# Patient Record
Sex: Female | Born: 1995 | Race: Black or African American | Hispanic: No | Marital: Married
Health system: Midwestern US, Academic
[De-identification: ages and names within clinical notes are randomized; demographics above are authoritative.]

## PROBLEM LIST (undated history)

## (undated) ENCOUNTER — Inpatient Hospital Stay (HOSPITAL_COMMUNITY): Payer: Self-pay

## (undated) DIAGNOSIS — Z789 Other specified health status: Secondary | ICD-10-CM

## (undated) HISTORY — PX: WISDOM TOOTH EXTRACTION: SHX21

---

## 2017-10-24 ENCOUNTER — Encounter: Payer: Self-pay | Admitting: *Deleted

## 2017-10-25 ENCOUNTER — Inpatient Hospital Stay (HOSPITAL_COMMUNITY)
Admission: AD | Admit: 2017-10-25 | Discharge: 2017-10-25 | Disposition: A | Discharge status: Home / Self Care | Payer: Medicaid Other | Source: Ambulatory Visit | Attending: Obstetrics & Gynecology | Admitting: Obstetrics & Gynecology

## 2017-10-25 ENCOUNTER — Other Ambulatory Visit: Payer: Self-pay

## 2017-10-25 ENCOUNTER — Encounter (HOSPITAL_COMMUNITY): Payer: Self-pay

## 2017-10-25 DIAGNOSIS — N760 Acute vaginitis: Secondary | ICD-10-CM | POA: Insufficient documentation

## 2017-10-25 DIAGNOSIS — B9689 Other specified bacterial agents as the cause of diseases classified elsewhere: Secondary | ICD-10-CM | POA: Diagnosis not present

## 2017-10-25 DIAGNOSIS — O23592 Infection of other part of genital tract in pregnancy, second trimester: Secondary | ICD-10-CM | POA: Diagnosis not present

## 2017-10-25 DIAGNOSIS — Z3A26 26 weeks gestation of pregnancy: Secondary | ICD-10-CM | POA: Insufficient documentation

## 2017-10-25 DIAGNOSIS — O0932 Supervision of pregnancy with insufficient antenatal care, second trimester: Secondary | ICD-10-CM | POA: Diagnosis not present

## 2017-10-25 DIAGNOSIS — O9989 Other specified diseases and conditions complicating pregnancy, childbirth and the puerperium: Secondary | ICD-10-CM | POA: Diagnosis not present

## 2017-10-25 DIAGNOSIS — M545 Low back pain: Secondary | ICD-10-CM | POA: Diagnosis present

## 2017-10-25 DIAGNOSIS — M549 Dorsalgia, unspecified: Secondary | ICD-10-CM | POA: Diagnosis not present

## 2017-10-25 HISTORY — DX: Other specified health status: Z78.9

## 2017-10-25 LAB — URINALYSIS, ROUTINE W REFLEX MICROSCOPIC
BILIRUBIN URINE: NEGATIVE
Glucose, UA: NEGATIVE mg/dL
Hgb urine dipstick: NEGATIVE
KETONES UR: NEGATIVE mg/dL
Nitrite: NEGATIVE
PH: 7 (ref 5.0–8.0)
Protein, ur: NEGATIVE mg/dL
SPECIFIC GRAVITY, URINE: 1.009 (ref 1.005–1.030)

## 2017-10-25 LAB — DIFFERENTIAL
Basophils Absolute: 0 10*3/uL (ref 0.0–0.1)
Basophils Relative: 0 %
EOS PCT: 1 %
Eosinophils Absolute: 0.1 10*3/uL (ref 0.0–0.7)
LYMPHS ABS: 2.3 10*3/uL (ref 0.7–4.0)
LYMPHS PCT: 24 %
MONO ABS: 0.4 10*3/uL (ref 0.1–1.0)
MONOS PCT: 4 %
NEUTROS ABS: 6.6 10*3/uL (ref 1.7–7.7)
Neutrophils Relative %: 71 %

## 2017-10-25 LAB — WET PREP, GENITAL
SPERM: NONE SEEN
Trich, Wet Prep: NONE SEEN
Yeast Wet Prep HPF POC: NONE SEEN

## 2017-10-25 LAB — CBC
HEMATOCRIT: 29.1 % — AB (ref 36.0–46.0)
HEMOGLOBIN: 9.2 g/dL — AB (ref 12.0–15.0)
MCH: 26 pg (ref 26.0–34.0)
MCHC: 31.6 g/dL (ref 30.0–36.0)
MCV: 82.2 fL (ref 78.0–100.0)
Platelets: 298 10*3/uL (ref 150–400)
RBC: 3.54 MIL/uL — ABNORMAL LOW (ref 3.87–5.11)
RDW: 18.1 % — ABNORMAL HIGH (ref 11.5–15.5)
WBC: 9.4 10*3/uL (ref 4.0–10.5)

## 2017-10-25 LAB — ABO/RH: ABO/RH(D): O POS

## 2017-10-25 MED ORDER — METRONIDAZOLE 500 MG PO TABS
500.0000 mg | ORAL_TABLET | Freq: Two times a day (BID) | ORAL | 0 refills | Status: DC
Start: 2017-10-25 — End: 2017-11-04

## 2017-10-25 MED ORDER — IBUPROFEN 600 MG PO TABS
600.0000 mg | ORAL_TABLET | Freq: Once | ORAL | Status: AC
  Administered 2017-10-25: 600 mg via ORAL
  Filled 2017-10-25: qty 1

## 2017-10-25 NOTE — MAU Note (Signed)
Relocated from [place], has first appt in clinic on Dec 17th.  ? Contractions since last night.  Feels like tightnings and back pain. Baby moving a lot. No bleeding. No leaking.

## 2017-10-25 NOTE — Discharge Instructions (Signed)
Back Injury Prevention Back injuries can be very painful. They can also be difficult to heal. After having one back injury, you are more likely to injure your back again. It is important to learn how to avoid injuring or re-injuring your back. The following tips can help you to prevent a back injury. What should I know about physical fitness?  Exercise for 30 minutes per day on most days of the week or as directed by your health care provider. Make sure to: ? Do aerobic exercises, such as walking, jogging, biking, or swimming. ? Do exercises that increase balance and strength, such as tai chi and yoga. These can decrease your risk of falling and injuring your back. ? Do stretching exercises to help with flexibility. ? Try to develop strong abdominal muscles. Your abdominal muscles provide a lot of the support that is needed by your back.  Maintain a healthy weight. This helps to decrease your risk of a back injury. What should I know about my diet?  Talk with your health care provider about your overall diet. Take supplements and vitamins only as directed by your health care provider.  Talk with your health care provider about how much calcium and vitamin D you need each day. These nutrients help to prevent weakening of the bones (osteoporosis). Osteoporosis can cause broken (fractured) bones, which lead to back pain.  Include good sources of calcium in your diet, such as dairy products, green leafy vegetables, and products that have had calcium added to them (fortified).  Include good sources of vitamin D in your diet, such as milk and foods that are fortified with vitamin D. What should I know about my posture?  Sit up straight and stand up straight. Avoid leaning forward when you sit or hunching over when you stand.  Choose chairs that have good low-back (lumbar) support.  If you work at a desk, sit close to it so you do not need to lean over. Keep your chin tucked in. Keep your neck  drawn back, and keep your elbows bent at a right angle. Your arms should look like the letter "L."  Sit high and close to the steering wheel when you drive. Add a lumbar support to your car seat, if needed.  Avoid sitting or standing in one position for very long. Take breaks to get up, stretch, and walk around at least one time every hour. Take breaks every hour if you are driving for long periods of time.  Sleep on your side with your knees slightly bent, or sleep on your back with a pillow under your knees. Do not lie on the front of your body to sleep. What should I know about lifting, twisting, and reaching? Lifting and Heavy Lifting   Avoid heavy lifting, especially repetitive heavy lifting. If you must do heavy lifting: ? Stretch before lifting. ? Work slowly. ? Rest between lifts. ? Use a tool such as a cart or a dolly to move objects if one is available. ? Make several small trips instead of carrying one heavy load. ? Ask for help when you need it, especially when moving big objects.  Follow these steps when lifting: ? Stand with your feet shoulder-width apart. ? Get as close to the object as you can. Do not try to pick up a heavy object that is far from your body. ? Use handles or lifting straps if they are available. ? Bend at your knees. Squat down, but keep your heels off the  floor. ? Keep your shoulders pulled back, your chin tucked in, and your back straight. ? Lift the object slowly while you tighten the muscles in your legs, abdomen, and buttocks. Keep the object as close to the center of your body as possible.  Follow these steps when putting down a heavy load: ? Stand with your feet shoulder-width apart. ? Lower the object slowly while you tighten the muscles in your legs, abdomen, and buttocks. Keep the object as close to the center of your body as possible. ? Keep your shoulders pulled back, your chin tucked in, and your back straight. ? Bend at your knees. Squat  down, but keep your heels off the floor. ? Use handles or lifting straps if they are available. Twisting and Reaching  Avoid lifting heavy objects above your waist.  Do not twist at your waist while you are lifting or carrying a load. If you need to turn, move your feet.  Do not bend over without bending at your knees.  Avoid reaching over your head, across a table, or for an object on a high surface. What are some other tips?  Avoid wet floors and icy ground. Keep sidewalks clear of ice to prevent falls.  Do not sleep on a mattress that is too soft or too hard.  Keep items that are used frequently within easy reach.  Put heavier objects on shelves at waist level, and put lighter objects on lower or higher shelves.  Find ways to decrease your stress, such as exercise, massage, or relaxation techniques. Stress can build up in your muscles. Tense muscles are more vulnerable to injury.  Talk with your health care provider if you feel anxious or depressed. These conditions can make back pain worse.  Wear flat heel shoes with cushioned soles.  Avoid sudden movements.  Use both shoulder straps when carrying a backpack.  Do not use any tobacco products, including cigarettes, chewing tobacco, or electronic cigarettes. If you need help quitting, ask your health care provider. This information is not intended to replace advice given to you by your health care provider. Make sure you discuss any questions you have with your health care provider. Document Released: 12/15/2004 Document Revised: 04/14/2016 Document Reviewed: 11/11/2014 Elsevier Interactive Patient Education  2017 [place] of Pregnancy The second trimester is from week 13 through week 28, month 4 through 6. This is often the time in pregnancy that you feel your best. Often times, morning sickness has lessened or quit. You may have more energy, and you may get hungry more often. Your unborn baby (fetus)  is growing rapidly. At the end of the sixth month, he or she is about 9 inches long and weighs about 1 pounds. You will likely feel the baby move (quickening) between 18 and 20 weeks of pregnancy. Follow these instructions at home:  Avoid all smoking, herbs, and alcohol. Avoid drugs not approved by your doctor.  Do not use any tobacco products, including cigarettes, chewing tobacco, and electronic cigarettes. If you need help quitting, ask your doctor. You may get counseling or other support to help you quit.  Only take medicine as told by your doctor. Some medicines are safe and some are not during pregnancy.  Exercise only as told by your doctor. Stop exercising if you start having cramps.  Eat regular, healthy meals.  Wear a good support bra if your breasts are tender.  Do not use hot tubs, steam rooms, or saunas.  Wear your  seat belt when driving.  Avoid raw meat, uncooked cheese, and liter boxes and soil used by cats.  Take your prenatal vitamins.  Take 1500-2000 milligrams of calcium daily starting at the 20th week of pregnancy until you deliver your baby.  Try taking medicine that helps you poop (stool softener) as needed, and if your doctor approves. Eat more fiber by eating fresh fruit, vegetables, and whole grains. Drink enough fluids to keep your pee (urine) clear or pale yellow.  Take warm water baths (sitz baths) to soothe pain or discomfort caused by hemorrhoids. Use hemorrhoid cream if your doctor approves.  If you have puffy, bulging veins (varicose veins), wear support hose. Raise (elevate) your feet for 15 minutes, 3-4 times a day. Limit salt in your diet.  Avoid heavy lifting, wear low heals, and sit up straight.  Rest with your legs raised if you have leg cramps or low back pain.  Visit your dentist if you have not gone during your pregnancy. Use a soft toothbrush to brush your teeth. Be gentle when you floss.  You can have sex (intercourse) unless your  doctor tells you not to.  Go to your doctor visits. Get help if:  You feel dizzy.  You have mild cramps or pressure in your lower belly (abdomen).  You have a nagging pain in your belly area.  You continue to feel sick to your stomach (nauseous), throw up (vomit), or have watery poop (diarrhea).  You have bad smelling fluid coming from your vagina.  You have pain with peeing (urination). Get help right away if:  You have a fever.  You are leaking fluid from your vagina.  You have spotting or bleeding from your vagina.  You have severe belly cramping or pain.  You lose or gain weight rapidly.  You have trouble catching your breath and have chest pain.  You notice sudden or extreme puffiness (swelling) of your face, hands, ankles, feet, or legs.  You have not felt the baby move in over an hour.  You have severe headaches that do not go away with medicine.  You have vision changes. This information is not intended to replace advice given to you by your health care provider. Make sure you discuss any questions you have with your health care provider. Document Released: 02/01/2010 Document Revised: 04/14/2016 Document Reviewed: 01/08/2013 Elsevier Interactive Patient Education  2017 [employer].

## 2017-10-25 NOTE — MAU Provider Note (Signed)
History     CSN: [id number]  Arrival date and time: 10/25/17 1859   None     Chief Complaint  Patient presents with  . Contractions   HPI   Ms.Nancy Hess is a 21 y.o. female G2P1001 @ 2987w0d here in MAU with contraction like pain. States she feels her abdomen tightening off and on. She feels pain in her lower back that comes and goes. The tightening in her abdomen is not painful. The pain is in her lower back and that started last night. She has not had this pain before in this pregancy. No history of preterm delivery. [facility] an appointment in [facility] on [facility]; no prenatal care as of date. No recent intercourse.  Has not tried any medication for the pain.   OB History    Gravida Para Term Preterm AB Living   2 1 1     1    SAB TAB Ectopic Multiple Live Births           1      Past Medical History:  Diagnosis Date  . Medical history non-contributory     Past Surgical History:  Procedure Laterality Date  . WISDOM TOOTH EXTRACTION      Family History  Family history unknown: Yes    Social History   Tobacco Use  . Smoking status: Never Smoker  . Smokeless tobacco: Never Used  Substance Use Topics  . Alcohol use: No    Frequency: Never  . Drug use: No    Allergies: No Known Allergies  No medications prior to admission.   Results for orders placed or performed during the hospital encounter of 10/25/17 (from the past 48 hour(s))  Urinalysis, Routine w reflex microscopic     Status: Abnormal   Collection Time: 10/25/17  5:05 PM  Result Value Ref Range   Color, Urine YELLOW YELLOW   APPearance HAZY (A) CLEAR   Specific Gravity, Urine 1.009 1.005 - 1.030   pH 7.0 5.0 - 8.0   Glucose, UA NEGATIVE NEGATIVE mg/dL   Hgb urine dipstick NEGATIVE NEGATIVE   Bilirubin Urine NEGATIVE NEGATIVE   Ketones, ur NEGATIVE NEGATIVE mg/dL   Protein, ur NEGATIVE NEGATIVE mg/dL   Nitrite NEGATIVE NEGATIVE   Leukocytes, UA LARGE (A) NEGATIVE   RBC / HPF 0-5 0 - 5 RBC/hpf   WBC,  UA 0-5 0 - 5 WBC/hpf   Bacteria, UA FEW (A) NONE SEEN   Squamous Epithelial / LPF 6-30 (A) NONE SEEN   Mucus PRESENT   Wet prep, genital     Status: Abnormal   Collection Time: 10/25/17  8:20 PM  Result Value Ref Range   Yeast Wet Prep HPF POC NONE SEEN NONE SEEN   Trich, Wet Prep NONE SEEN NONE SEEN   Clue Cells Wet Prep HPF POC PRESENT (A) NONE SEEN   WBC, Wet Prep HPF POC MANY (A) NONE SEEN    Comment: MANY BACTERIA SEEN   Sperm NONE SEEN    Review of Systems  Gastrointestinal: Negative for abdominal pain, constipation, nausea and rectal pain.  Genitourinary: Negative for dysuria, flank pain, hematuria, vaginal bleeding and vaginal discharge.  Musculoskeletal: Positive for back pain.   Physical Exam   Blood pressure (!) 111/59, pulse (!) 116, temperature 98 F (36.7 C), temperature source Oral, resp. rate 18, height 5\' 3"  (1.6 m), weight 150 lb (68 kg), last menstrual period 05/10/2017, SpO2 100 %.  Physical Exam  Constitutional: She is oriented to person, place, and  time. She appears well-developed and well-nourished. No distress.  HENT:  Head: Normocephalic.  Eyes: Pupils are equal, round, and reactive to light.  GI: Soft. Normal appearance. There is no CVA tenderness.  Genitourinary:  Genitourinary Comments: Vagina - Small- moderate amount of white vaginal discharge, + odor  Cervix - No contact bleeding, no active bleeding  Bimanual exam: Cervix closed GC/Chlam, wet prep done Chaperone present for exam.   Musculoskeletal: Normal range of motion.  Neurological: She is alert and oriented to person, place, and time.  Skin: Skin is warm. She is not diaphoretic.  Psychiatric: Her behavior is normal.   Fetal Tracing: Baseline: 145 bpm Variability: Moderate  Accelerations: 10x10 Decelerations: None Toco: None  MAU Course  Procedures  None  MDM  Prenatal panel collected Urine culture pending Wet prep and GC  Ibuprofen 600 mg PO given in MAU; waiting to see if  pain decreases. Report given to M. Mayford KnifeWilliams CNM who resumes care of the patient.   Assessment and Plan   A:  1. BV (bacterial vaginosis)   2. Back pain affecting pregnancy in second trimester   3. No prenatal care in current pregnancy in second trimester     P:  Discharge home in stable condition, with strict preterm labor precautions Follow up in the [facility] as scheduled US ordered; they will call to schedule No ibuprofen after 29 weeks Return to MAU if symptoms worsen Rx: Flagyl   Nancy CarbonJennifer Rasch 10/25/2017, 8:13 PM   Pain is greatly improved after ibuprofen Will discharge home Has appt for clinic followup Aviva SignsWilliams, Nancy Hess, CNM

## 2017-10-26 LAB — RPR: RPR: NONREACTIVE

## 2017-10-26 LAB — HIV ANTIBODY (ROUTINE TESTING W REFLEX): HIV Screen 4th Generation wRfx: NONREACTIVE

## 2017-10-26 LAB — HEPATITIS B SURFACE ANTIGEN: Hepatitis B Surface Ag: NEGATIVE

## 2017-10-26 LAB — GC/CHLAMYDIA PROBE AMP (~~LOC~~) NOT AT ARMC
Chlamydia: NEGATIVE
NEISSERIA GONORRHEA: NEGATIVE

## 2017-10-27 LAB — CULTURE, OB URINE
Culture: 30000 — AB
Special Requests: NORMAL

## 2017-10-27 LAB — RUBELLA SCREEN: Rubella: 1.62 {index}

## 2017-11-04 ENCOUNTER — Encounter (HOSPITAL_COMMUNITY): Payer: Self-pay | Admitting: *Deleted

## 2017-11-04 ENCOUNTER — Inpatient Hospital Stay (HOSPITAL_COMMUNITY)
Admission: AD | Admit: 2017-11-04 | Discharge: 2017-11-04 | Disposition: A | Discharge status: Home / Self Care | Payer: Medicaid Other | Source: Ambulatory Visit | Attending: Obstetrics and Gynecology | Admitting: Obstetrics and Gynecology

## 2017-11-04 DIAGNOSIS — R1030 Lower abdominal pain, unspecified: Secondary | ICD-10-CM | POA: Diagnosis present

## 2017-11-04 DIAGNOSIS — O4702 False labor before 37 completed weeks of gestation, second trimester: Secondary | ICD-10-CM

## 2017-11-04 DIAGNOSIS — N760 Acute vaginitis: Secondary | ICD-10-CM

## 2017-11-04 DIAGNOSIS — B9689 Other specified bacterial agents as the cause of diseases classified elsewhere: Secondary | ICD-10-CM | POA: Insufficient documentation

## 2017-11-04 DIAGNOSIS — O23592 Infection of other part of genital tract in pregnancy, second trimester: Secondary | ICD-10-CM | POA: Insufficient documentation

## 2017-11-04 DIAGNOSIS — Z3A27 27 weeks gestation of pregnancy: Secondary | ICD-10-CM | POA: Diagnosis not present

## 2017-11-04 DIAGNOSIS — O0932 Supervision of pregnancy with insufficient antenatal care, second trimester: Secondary | ICD-10-CM | POA: Insufficient documentation

## 2017-11-04 LAB — URINALYSIS, ROUTINE W REFLEX MICROSCOPIC
Bilirubin Urine: NEGATIVE
GLUCOSE, UA: NEGATIVE mg/dL
HGB URINE DIPSTICK: NEGATIVE
Ketones, ur: NEGATIVE mg/dL
NITRITE: NEGATIVE
PH: 7 (ref 5.0–8.0)
Protein, ur: NEGATIVE mg/dL
Specific Gravity, Urine: 1.01 (ref 1.005–1.030)

## 2017-11-04 MED ORDER — CLINDAMYCIN HCL 300 MG PO CAPS: 300.0000 mg | ORAL_CAPSULE | Freq: Two times a day (BID) | ORAL | 0 refills | Status: AC

## 2017-11-04 NOTE — MAU Note (Signed)
Contractions for a week. Stronger tonight. Denies LOF or bleeding. Cervix closed last wk

## 2017-11-04 NOTE — MAU Note (Signed)
PT SAYS  SOMETIME  LAST WEEK SHE WAS HERE  FOR  [facility].       ALL WEEK SHE  HAS  CONTINUED  TO CONTRACT.    LAST SEX-   SEPT.    SHE HAD 1  PNV    IN [place],   SHE HAS AN APPOINTMENT ON 11-06-17 IN CLINIC

## 2017-11-04 NOTE — MAU Provider Note (Cosign Needed)
  History     CSN: [id number]  Arrival date and time: 11/04/17 0120   None     Chief Complaint  Patient presents with  . Contractions   21 yo G2P1001 at 1547w3d presents with uncomfortable tightening in her lower abdomen that occurs every 10 minutes for the past 10 days. Was seen in MAU for same 10 days ago and diagnosed with BV- prescribed flagyl and patient has been taking as prescribed. Nothing in vagina in last 2 days. Denies vaginal bleeding or loss of fluid. Admits to vaginal discharge and does not think medicine has helped BV. She has had no prenatal care this pregnancy.     Past Medical History:  Diagnosis Date  . Medical history non-contributory     Past Surgical History:  Procedure Laterality Date  . WISDOM TOOTH EXTRACTION      Family History  Family history unknown: Yes    Social History   Tobacco Use  . Smoking status: Never Smoker  . Smokeless tobacco: Never Used  Substance Use Topics  . Alcohol use: No    Frequency: Never  . Drug use: No    Allergies: No Known Allergies  Medications Prior to Admission  Medication Sig Dispense Refill Last Dose  . metroNIDAZOLE (FLAGYL) 500 MG tablet Take 1 tablet (500 mg total) by mouth 2 (two) times daily. 14 tablet 0     Review of Systems  Constitutional: Negative for activity change and appetite change.  HENT: Negative for congestion and dental problem.   Eyes: Negative for discharge and itching.  Respiratory: Negative for apnea and chest tightness.   Cardiovascular: Negative for chest pain and leg swelling.  Gastrointestinal: Positive for abdominal pain. Negative for vomiting.  Endocrine: Negative for cold intolerance and heat intolerance.  Genitourinary: Positive for vaginal discharge. Negative for vaginal bleeding.  Musculoskeletal: Negative for arthralgias and back pain.  Neurological: Negative for dizziness and light-headedness.  Hematological: Negative for adenopathy. Does not bruise/bleed easily.    Physical Exam   Blood pressure 103/61, pulse 99, temperature 98.1 F (36.7 C), resp. rate 18, height 5\' 3"  (1.6 m), weight 152 lb (68.9 kg), last menstrual period 05/10/2017.  Physical Exam  Constitutional: She is oriented to person, place, and time. She appears well-developed and well-nourished.  HENT:  Head: Normocephalic and atraumatic.  Eyes: Conjunctivae are normal. Pupils are equal, round, and reactive to light.  Neck: Normal range of motion. Neck supple.  Cardiovascular: Normal rate and intact distal pulses.  Respiratory: Effort normal. No respiratory distress.  GI: Soft. There is no tenderness. There is no rebound and no guarding.  Genitourinary: Vaginal discharge found.  Genitourinary Comments: Cervical exam: closed/thick/high  Musculoskeletal: Normal range of motion. She exhibits no edema.  Neurological: She is alert and oriented to person, place, and time.  Skin: Skin is warm and dry.  Psychiatric: She has a normal mood and affect. Her behavior is normal.    MAU Course  Procedures  EFM: 140 bpm/ mod var/no decels Toco: no contractions  MDM Cervix is closed and no contractions on toco, so low suspicion for preterm labor. Evaluated vaginal discharge under microscope and saw many clue cells.  Assessment and Plan  1. Pregnancy at 27 weeks completed gestation- follow up in 2 days for initial prenatal appointment 2. Bacterial vaginosis- treat with clindamycin since symptoms have not improved with metronidizole  Chubb Corporationmber Rogene Meth 11/04/2017, 1:52 AM

## 2017-11-04 NOTE — Discharge Instructions (Signed)
Bacterial Vaginosis Bacterial vaginosis is a vaginal infection that occurs when the normal balance of bacteria in the vagina is disrupted. It results from an overgrowth of certain bacteria. This is the most common vaginal infection among women ages 15-44. Because bacterial vaginosis increases your risk for STIs (sexually transmitted infections), getting treated can help reduce your risk for chlamydia, gonorrhea, herpes, and HIV (human immunodeficiency virus). Treatment is also important for preventing complications in pregnant women, because this condition can cause an early (premature) delivery. What are the causes? This condition is caused by an increase in harmful bacteria that are normally present in small amounts in the vagina. However, the reason that the condition develops is not fully understood. What increases the risk? The following factors may make you more likely to develop this condition:  Having a new sexual partner or multiple sexual partners.  Having unprotected sex.  Douching.  Having an intrauterine device (IUD).  Smoking.  Drug and alcohol abuse.  Taking certain antibiotic medicines.  Being pregnant.  You cannot get bacterial vaginosis from toilet seats, bedding, swimming pools, or contact with objects around you. What are the signs or symptoms? Symptoms of this condition include:  Grey or white vaginal discharge. The discharge can also be watery or foamy.  A fish-like odor with discharge, especially after sexual intercourse or during menstruation.  Itching in and around the vagina.  Burning or pain with urination.  Some women with bacterial vaginosis have no signs or symptoms. How is this diagnosed? This condition is diagnosed based on:  Your medical history.  A physical exam of the vagina.  Testing a sample of vaginal fluid under a microscope to look for a large amount of bad bacteria or abnormal cells. Your health care provider may use a cotton swab  or a small wooden spatula to collect the sample.  How is this treated? This condition is treated with antibiotics. These may be given as a pill, a vaginal cream, or a medicine that is put into the vagina (suppository). If the condition comes back after treatment, a second round of antibiotics may be needed. Follow these instructions at home: Medicines  Take over-the-counter and prescription medicines only as told by your health care provider.  Take or use your antibiotic as told by your health care provider. Do not stop taking or using the antibiotic even if you start to feel better. General instructions  If you have a female sexual partner, tell her that you have a vaginal infection. She should see her health care provider and be treated if she has symptoms. If you have a female sexual partner, he does not need treatment.  During treatment: ? Avoid sexual activity until you finish treatment. ? Do not douche. ? Avoid alcohol as directed by your health care provider. ? Avoid breastfeeding as directed by your health care provider.  Drink enough water and fluids to keep your urine clear or pale yellow.  Keep the area around your vagina and rectum clean. ? Wash the area daily with warm water. ? Wipe yourself from front to back after using the toilet.  Keep all follow-up visits as told by your health care provider. This is important. How is this prevented?  Do not douche.  Wash the outside of your vagina with warm water only.  Use protection when having sex. This includes latex condoms and dental dams.  Limit how many sexual partners you have. To help prevent bacterial vaginosis, it is best to have sex with just   one partner (monogamous).  Make sure you and your sexual partner are tested for STIs.  Wear cotton or cotton-lined underwear.  Avoid wearing tight pants and pantyhose, especially during summer.  Limit the amount of alcohol that you drink.  Do not use any products that  contain nicotine or tobacco, such as cigarettes and e-cigarettes. If you need help quitting, ask your health care provider.  Do not use illegal drugs. Where to find more information:  Centers for Disease Control and Prevention: www.cdc.gov/std  American Sexual Health Association (ASHA): www.ashastd.org  [place].S. Department of Health and Human Services, Office on Women's Health: www.womenshealth.gov/ or https://www.womenshealth.gov/a-z-topics/bacterial-vaginosis Contact a health care provider if:  Your symptoms do not improve, even after treatment.  You have more discharge or pain when urinating.  You have a fever.  You have pain in your abdomen.  You have pain during sex.  You have vaginal bleeding between periods. Summary  Bacterial vaginosis is a vaginal infection that occurs when the normal balance of bacteria in the vagina is disrupted.  Because bacterial vaginosis increases your risk for STIs (sexually transmitted infections), getting treated can help reduce your risk for chlamydia, gonorrhea, herpes, and HIV (human immunodeficiency virus). Treatment is also important for preventing complications in pregnant women, because the condition can cause an early (premature) delivery.  This condition is treated with antibiotic medicines. These may be given as a pill, a vaginal cream, or a medicine that is put into the vagina (suppository). This information is not intended to replace advice given to you by your health care provider. Make sure you discuss any questions you have with your health care provider. Document Released: 11/07/2005 Document Revised: 07/23/2016 Document Reviewed: 07/23/2016 Elsevier Interactive Patient Education  2017 [employer]  

## 2017-11-06 ENCOUNTER — Encounter: Payer: Self-pay | Admitting: Advanced Practice Midwife

## 2017-11-06 ENCOUNTER — Ambulatory Visit (INDEPENDENT_AMBULATORY_CARE_PROVIDER_SITE_OTHER): Payer: Medicaid Other | Admitting: Medical

## 2017-11-06 VITALS — BP 108/70 | HR 111 | Wt 155.4 lb

## 2017-11-06 DIAGNOSIS — O0933 Supervision of pregnancy with insufficient antenatal care, third trimester: Secondary | ICD-10-CM

## 2017-11-06 DIAGNOSIS — Z3482 Encounter for supervision of other normal pregnancy, second trimester: Secondary | ICD-10-CM

## 2017-11-06 DIAGNOSIS — Z3480 Encounter for supervision of other normal pregnancy, unspecified trimester: Secondary | ICD-10-CM

## 2017-11-06 DIAGNOSIS — O0932 Supervision of pregnancy with insufficient antenatal care, second trimester: Secondary | ICD-10-CM

## 2017-11-06 NOTE — Progress Notes (Signed)
   PRENATAL VISIT NOTE  Subjective:  Nancy Hess is a 21 y.o. G2P1001 at 6469w5d being seen today for her first prenatal care visit.  She is currently monitored for the following issues for this low-risk pregnancy and has Supervision of other normal pregnancy, antepartum on their problem list.  Patient reports occasional contractions. q 14 minutes today which is the same as she has been experiencing. She was seen in [facility] twice in the past 10 days with the same complaint. She was closed/thick at both visits, most recently 11/04/17. She is being treated with vaginal clindamycin for BV that was unresponsive to Flagyl. Contractions: Irritability. Vag. Bleeding: None.  Movement: Present. Denies leaking of fluid.   The following portions of the patient's history were reviewed and updated as appropriate: allergies, current medications, past family history, past medical history, past social history, past surgical history and problem list. Problem list updated.  Objective:   Vitals:   11/06/17 0813  BP: 108/70  Pulse: (!) 111  Weight: 155 lb 6.4 oz (70.5 kg)    Fetal Status: Fetal Heart Rate (bpm): 151 Fundal Height: 26 cm Movement: Present     General:  Alert, oriented and cooperative. Patient is in no acute distress.  Skin: Skin is warm and dry. No rash noted.   Cardiovascular: Normal heart rate noted  Respiratory: Normal respiratory effort, no problems with respiration noted  Abdomen: Soft, gravid, appropriate for gestational age.  Pain/Pressure: Present     Pelvic: Cervical exam deferred        Extremities: Normal range of motion.  Edema: None  Mental Status:  Normal mood and affect. Normal behavior. Normal judgment and thought content.   Assessment and Plan:  Pregnancy: G2P1001 at 8169w5d  1. Supervision of other normal pregnancy, antepartum - Antibody screen - [employer] Schedule Optimization  2. Late prenatal care - [place] but [place] care prior to [place]    Preterm labor symptoms and general obstetric precautions including but not limited to vaginal bleeding, contractions, leaking of fluid and fetal movement were reviewed in detail with the patient. Please refer to After Visit Summary for other counseling recommendations.  Return in about 2 weeks (around 11/20/2017) for LOB, 28 week labs (fasting).   Vonzella NippleJulie Wenzel, PA-C

## 2017-11-06 NOTE — Patient Instructions (Addendum)
Fetal Movement Counts Patient Name: ________________________________________________ Patient Due Date: ____________________ What is a fetal movement count? A fetal movement count is the number of times that you feel your baby move during a certain amount of time. This may also be called a fetal kick count. A fetal movement count is recommended for every pregnant woman. You may be asked to start counting fetal movements as early as week 28 of your pregnancy. Pay attention to when your baby is most active. You may notice your baby's sleep and wake cycles. You may also notice things that make your baby move more. You should do a fetal movement count:  When your baby is normally most active.  At the same time each day.  A good time to count movements is while you are resting, after having something to eat and drink. How do I count fetal movements? 1. Find a quiet, comfortable area. Sit, or lie down on your side. 2. Write down the date, the start time and stop time, and the number of movements that you felt between those two times. Take this information with you to your health care visits. 3. For 2 hours, count kicks, flutters, swishes, rolls, and jabs. You should feel at least 10 movements during 2 hours. 4. You may stop counting after you have felt 10 movements. 5. If you do not feel 10 movements in 2 hours, have something to eat and drink. Then, keep resting and counting for 1 hour. If you feel at least 4 movements during that hour, you may stop counting. Contact a health care provider if:  You feel fewer than 4 movements in 2 hours.  Your baby is not moving like he or she usually does. Date: ____________ Start time: ____________ Stop time: ____________ Movements: ____________ Date: ____________ Start time: ____________ Stop time: ____________ Movements: ____________ Date: ____________ Start time: ____________ Stop time: ____________ Movements: ____________ Date: ____________ Start time:  ____________ Stop time: ____________ Movements: ____________ Date: ____________ Start time: ____________ Stop time: ____________ Movements: ____________ Date: ____________ Start time: ____________ Stop time: ____________ Movements: ____________ Date: ____________ Start time: ____________ Stop time: ____________ Movements: ____________ Date: ____________ Start time: ____________ Stop time: ____________ Movements: ____________ Date: ____________ Start time: ____________ Stop time: ____________ Movements: ____________ This information is not intended to replace advice given to you by your health care provider. Make sure you discuss any questions you have with your health care provider. Document Released: 12/07/2006 Document Revised: 07/06/2016 Document Reviewed: 12/17/2015 Elsevier Interactive Patient Education  2018 [employer]. [employer] of the uterus can occur throughout pregnancy, but they are not always a sign that you are in labor. You may have practice contractions called Braxton Hicks contractions. These false labor contractions are sometimes confused with true labor. What are [place] contractions? Braxton Hicks contractions are tightening movements that occur in the muscles of the uterus before labor. Unlike true labor contractions, these contractions do not result in opening (dilation) and thinning of the cervix. Toward the end of pregnancy (32-34 weeks), Braxton Hicks contractions can happen more often and may become stronger. These contractions are sometimes difficult to tell apart from true labor because they can be very uncomfortable. You should not feel embarrassed if you go to the hospital with false labor. Sometimes, the only way to tell if you are in true labor is for your health care provider to look for changes in the cervix. The health care provider will do a physical exam and may monitor your contractions.  If you are not in true labor, the exam  should show that your cervix is not dilating and your water has not broken. If there are no prenatal problems or other health problems associated with your pregnancy, it is completely safe for you to be sent home with false labor. You may continue to have Braxton Hicks contractions until you go into true labor. How can I tell the difference between true labor and false labor?  Differences ? False labor ? Contractions last 30-70 seconds.: Contractions are usually shorter and not as strong as true labor contractions. ? Contractions become very regular.: Contractions are usually irregular. ? Discomfort is usually felt in the top of the uterus, and it spreads to the lower abdomen and low back.: Contractions are often felt in the front of the lower abdomen and in the groin. ? Contractions do not go away with walking.: Contractions may go away when you walk around or change positions while lying down. ? Contractions usually become more intense and increase in frequency.: Contractions get weaker and are shorter-lasting as time goes on. ? The cervix dilates and gets thinner.: The cervix usually does not dilate or become thin. Follow these instructions at home:  Take over-the-counter and prescription medicines only as told by your health care provider.  Keep up with your usual exercises and follow other instructions from your health care provider.  Eat and drink lightly if you think you are going into labor.  If Braxton Hicks contractions are making you uncomfortable: ? Change your position from lying down or resting to walking, or change from walking to resting. ? Sit and rest in a tub of warm water. ? Drink enough fluid to keep your urine clear or pale yellow. Dehydration may cause these contractions. ? Do slow and deep breathing several times an hour.  Keep all follow-up prenatal visits as told by your health care provider. This is important. Contact a health care provider if:  You have a  fever.  You have continuous pain in your abdomen. Get help right away if:  Your contractions become stronger, more regular, and closer together.  You have fluid leaking or gushing from your vagina.  You pass blood-tinged mucus (bloody show).  You have bleeding from your vagina.  You have low back pain that you never had before.  You feel your baby's head pushing down and causing pelvic pressure.  Your baby is not moving inside you as much as it used to. Summary  Contractions that occur before labor are called Braxton Hicks contractions, false labor, or practice contractions.  Braxton Hicks contractions are usually shorter, weaker, farther apart, and less regular than true labor contractions. True labor contractions usually become progressively stronger and regular and they become more frequent.  Manage discomfort from [facility] contractions by changing position, resting in a warm bath, drinking plenty of water, or practicing deep breathing. This information is not intended to replace advice given to you by your health care provider. Make sure you discuss any questions you have with your health care provider. Document Released: 11/07/2005 Document Revised: 09/26/2016 Document Reviewed: 09/26/2016 Elsevier Interactive Patient Education  2017 [place] [address] [place], [place]  [address] Phone - [phone]   Fax - [phone]  [facility] [address] [place], [place] [address] Phone - [phone]   Fax - [place] [address]  [place], [place]  [address] Phone - [phone]   Fax - [phone]  [place]. [address], [place] [address] [place], [place]  [address] Phone - [phone]   Fax - [phone]  [place] [address] [place], [place]  [address] Phone - [phone]   Fax -  [phone]  [facility] [address] [place], [place]  [address] Phone - [phone]   Fax - [place] [address] [place], [place]  [address] Phone - [phone]   Fax - [phone]  [place] [address] [place], [place] [address] [place], [place]  [address] Phone - [phone]   Fax - [place] [address] [place], [place]  [address] Phone - [phone]   Fax - [phone] [facility] [place]. [address] [place], [place]  [address] Phone - [phone]   Fax - [phone]  [place] [address] [place], [place]  [address] Phone - [phone]   Fax - [phone]  [facility] [place], [place], [place]  [address] Phone - [phone]   Fax - [place] [address] [place], [place]  [address] Phone - [phone]   Fax - [phone]  [facility] [address] [place], [place]  [address] Phone - [place], [place]  [address] Phone - [phone]   Fax - [place] [address], [place], [place]  [address] Phone - [phone]   Fax - [phone]  [place] [address] [place], [place]  [address] Phone - [phone]   Fax - [place]. [place], [place]  [address] Phone - [phone]   Fax - [place], [place], [place]  [address] Phone - [phone]   Fax - [place] [address], [place], [place]  [address] Phone - [phone]   Fax - [phone]  [place] [address], [place], [place]  [address] Phone - [phone]   Fax - [place] [place], [place]  [address] Phone - [phone]   Fax - [phone]  [place] [address] [place], [place]  [address] Phone - [phone]   Fax - [phone] [place] [address] [place],   [address] Phone - [phone]   Fax - [place] [address] [place],   [address] Phone - [phone]   Fax - [phone]  [place] [address], [place],   [address] Phone - [phone]   Fax - [phone]  [place] MD [address] [place] [address] Phone [phone]  Fax [phone]   Childbirth Education Options: [facility] Department Classes:  Childbirth education classes can help you get ready for a positive parenting experience. You can also meet other expectant parents and get free stuff for your baby. Each class runs for five weeks on the same night and costs $45 for the mother-to-be and her support person. Medicaid covers the cost if you are eligible. Call [phone] to register. [facility]  Childbirth Education:  [phone] or [phone] or [email or web address]  Baby & Me Class: Discuss newborn & infant parenting and family adjustment issues with other new mothers in a relaxed environment. Each week brings a new speaker or baby-centered activity. We encourage new mothers to join [place] every Thursday at 11:00am. Babies birth until crawling. No registration or fee. Daddy [employer]: This course offers Dads-to-be the tools and knowledge needed to feel confident on their journey to becoming new fathers. Experienced dads, who have been trained as coaches, teach dads-to-be how to hold, comfort, diaper, swaddle and play with their infant while  being able to support the new mom as well. A class for men taught by men. $25/dad Big Brother/Big Sister: Let your children share in the joy of a new brother or sister in this special class designed just for them. Class includes discussion about how families care for babies: swaddling, holding, diapering, safety as well as how they can be helpful in their new role. This class is designed for children ages 49 to 18, but any age is welcome. Please register each child individually. $5/child  Mom Talk: This mom-led group offers support and connection to mothers as they journey through the adjustments and struggles of that sometimes overwhelming first year after the birth of a child. Tuesdays at 10:00am and Thursdays at 6:00pm. Babies welcome. No registration or fee. Breastfeeding Support Group: This group is a mother-to-mother support circle where moms have the opportunity to share their breastfeeding experiences. A Lactation Consultant is present for questions and concerns. Meets each Tuesday at 11:00am. No fee or registration. Breastfeeding Your Baby: Learn what to expect in the first days of breastfeeding your newborn.  This class will help you feel more confident with the skills needed to begin your breastfeeding experience. Many new mothers are concerned about breastfeeding after leaving the hospital. This class will also address the most common fears and challenges about breastfeeding during the first few weeks, months and beyond. (call for fee) Comfort Techniques and Tour: This 2 hour interactive class will provide you the opportunity to learn & practice hands-on techniques that can help relieve some of the discomfort of labor and encourage your baby to rotate toward the best position for birth. You and your partner will be able to try a variety of labor positions with birth balls and rebozos as well as practice breathing, relaxation, and visualization techniques. A tour of the [facility] is included with this class. $20 per registrant and support person Childbirth Class- Weekend Option: This class is a Weekend version of our Birth & Baby series. It is designed for parents who have a difficult time fitting several weeks of classes into their schedule. It covers the care of your newborn and the basics of labor and childbirth. It also includes a [place] of [facility] and lunch. The class is held two consecutive days: beginning on Friday evening from 6:30 - 8:30 p.m. and the next day, Saturday from 9 a.m. - 4 p.m. (call for fee) Doren Custard Class: Interested in a waterbirth?  This informational class will help you discover whether waterbirth is the right fit for you. Education about waterbirth itself, supplies you would need and how to assemble your support team is what you can expect from this class. Some obstetrical practices require this class in order to pursue a waterbirth. (Not all obstetrical practices offer waterbirth-check with your healthcare provider.) Register only the expectant mom, but you are encouraged to  bring your partner to class! Required if planning waterbirth, no fee. Infant/Child CPR: Parents, grandparents, babysitters, and friends learn Cardio-Pulmonary Resuscitation skills for infants and children. You will also learn how to treat both conscious and unconscious choking in infants and children. This Family & Friends program does not offer certification. Register each participant individually to ensure that enough mannequins are available. (Call for fee) Grandparent Love: Expecting a grandbaby? This class is for you! Learn about the latest infant care and safety recommendations and ways to support your own child as he or she transitions into the parenting role. Taught by Registered Nurses who are childbirth instructors, but most importantly...they are grandmothers too! $10/person. Childbirth Class- Natural Childbirth: This series of 5 weekly  classes is for expectant parents who want to learn and practice natural methods of coping with the process of labor and childbirth. Relaxation, breathing, massage, visualization, role of the partner, and helpful positioning are highlighted. Participants learn how to be confident in their body's ability to give birth. This class will empower and help parents make informed decisions about their own care. Includes discussion that will help new parents transition into the immediate postpartum period. [facility] is included. We suggest taking this class between 25-32 weeks, but it's only a recommendation. $75 per registrant and one support person or $30 Medicaid. Childbirth Class- 3 week Series: This option of 3 weekly classes helps you and your labor partner prepare for childbirth. Newborn care, labor & birth, cesarean birth, pain management, and comfort techniques are discussed and a [place] of [facility] is included. The class meets at the same time, on the same day of the week for 3 consecutive weeks beginning with the starting date you choose. $60 for registrant and one support person.  Marvelous Multiples: Expecting twins, triplets, or more? This class covers the differences in labor, birth, parenting, and breastfeeding issues that face multiples' parents. NICU tour is included. Led by a Certified Childbirth Educator who is the mother of twins. No fee. Caring for Baby: This class is for expectant and adoptive parents who want to learn and practice the most up-to-date newborn care for their babies. Focus is on birth through the first six weeks of life. Topics include feeding, bathing, diapering, crying, umbilical cord care, circumcision care and safe sleep. Parents learn to recognize symptoms of illness and when to call the pediatrician. Register only the mom-to-be and your partner or support person can plan to come with you! $10 per registrant and  support person Childbirth Class- online option: This online class offers you the freedom to complete a Birth and Baby series in the comfort of your own home. The flexibility of this option allows you to review sections at your own pace, at times convenient to you and your support people. It includes additional video information, animations, quizzes, and extended activities. Get organized with helpful eClass tools, checklists, and trackers. Once you register online for the class, you will receive an email within a few days to accept the invitation and begin the class when the time is right for you. The content will be available to you for 60 days. $60 for 60 days of online access for you and your support people.  Local Doulas: Natural Baby Doulas [email or web address] Tel: [phone] [email or web address] [phone] [email or web address] The Labor Hassell Halim  (also do waterbirth tub rental) [phone] [email or web address] Birth Doula [phone] [email or web address] [facility] Rhythms  [phone] [email or web address] [place] (  PADA) [email or web address] [place] Birth and Baby  [email or web address] Considering Waterbirth? Guide for patients at [facility] for [employer]  Why consider waterbirth?  . Gentle birth for babies . Less pain medicine used in labor . May allow for passive descent/less pushing . May reduce perineal tears  . More mobility and instinctive maternal position changes . Increased maternal relaxation . Reduced blood pressure in labor  Is waterbirth safe? What are the risks of infection, drowning or other complications?  . Infection: o Very low risk (3.7 % for tub vs 4.8% for bed) o 7 in 8000 waterbirths with documented  infection o Poorly cleaned equipment most common cause o Slightly lower group B strep transmission rate  . Drowning o Maternal:  - Very low risk   - Related to seizures or fainting o Newborn:  - Very low risk. No evidence of increased risk of respiratory problems in multiple large studies - Physiological protection from breathing under water - Avoid underwater birth if there are any fetal complications - Once baby's head is out of the water, keep it out.  . Birth complication o Some reports of cord trauma, but risk decreased by bringing baby to surface gradually o No evidence of increased risk of shoulder dystocia. Mothers can usually change positions faster in water than in a bed, possibly aiding the maneuvers to free the shoulder.   You must attend a Doren Custard class at [facility]  3rd Wednesday of every month from 7-9pm  [employer] by calling [phone] or online at [email or web address]  Bring [place] the certificate from the class to your prenatal appointment  Meet with a midwife at 36 weeks to see if you can still plan a waterbirth and to sign the consent.   Purchase or rent the following supplies:   Water Birth Pool (Birth Pool in a Box or [place] for instance)  (Tubs start ~$125)  Single-use disposable tub liner designed for your brand of tub  New garden hose labeled "lead-free", "suitable for drinking water",  Electric drain pump to remove water (We recommend 792 gallon per hour or greater pump.)   Separate garden hose to remove the dirty water  Fish net  Bathing suit top (optional)  Long-handled mirror (optional)  Places to purchase or rent supplies  [email or web address].is for tub purchases and supplies  Waterbirthsolutions.com for tub purchases and supplies  The Labor Ladies ([email or web address]) $275 for tub rental/set-up & take down/kit   [employer] (http://www.fleming.com/.htm) Information regarding doulas  (labor support) who provide pool rentals  Our practice has a Birth Pool in a Box tub at the hospital that you may borrow on a first-come-first-served basis. It is your responsibility to to set up, clean and break down the tub. We cannot guarantee the availability of this tub in advance. You are responsible for bringing all accessories listed above. If you do not have all necessary supplies you cannot have a waterbirth.    Things that would prevent you from having a waterbirth:  Premature, <37wks  Previous cesarean birth  Presence of thick meconium-stained fluid  Multiple gestation (Twins, triplets, etc.)  Uncontrolled diabetes or gestational diabetes requiring medication  Hypertension requiring medication or diagnosis of pre-eclampsia  Heavy vaginal bleeding  Non-reassuring fetal heart rate  Active infection (MRSA, etc.). Group B Strep is NOT a contraindication for  waterbirth.  If your labor has to be induced and induction method requires continuous  monitoring of the baby's heart rate  Other risks/issues identified by your obstetrical  provider  Please remember that birth is unpredictable. Under certain unforeseeable circumstances your provider may advise against giving birth in the tub. These decisions will be made on a case-by-case basis and with the safety of you and your baby as our highest priority.

## 2017-11-07 LAB — ANTIBODY SCREEN: ANTIBODY SCREEN: NEGATIVE

## 2017-11-08 ENCOUNTER — Other Ambulatory Visit: Payer: Self-pay | Admitting: Medical

## 2017-11-08 DIAGNOSIS — Z3480 Encounter for supervision of other normal pregnancy, unspecified trimester: Secondary | ICD-10-CM

## 2017-11-13 ENCOUNTER — Ambulatory Visit (HOSPITAL_COMMUNITY)
Admission: RE | Admit: 2017-11-13 | Discharge: 2017-11-13 | Disposition: A | Discharge status: Home / Self Care | Payer: Medicaid Other | Source: Ambulatory Visit | Attending: Obstetrics and Gynecology | Admitting: Obstetrics and Gynecology

## 2017-11-13 ENCOUNTER — Encounter (HOSPITAL_COMMUNITY): Payer: Self-pay

## 2017-11-13 ENCOUNTER — Other Ambulatory Visit (HOSPITAL_COMMUNITY): Payer: Self-pay | Admitting: Obstetrics and Gynecology

## 2017-11-13 DIAGNOSIS — Z363 Encounter for antenatal screening for malformations: Secondary | ICD-10-CM | POA: Diagnosis not present

## 2017-11-13 DIAGNOSIS — O0932 Supervision of pregnancy with insufficient antenatal care, second trimester: Secondary | ICD-10-CM

## 2017-11-13 DIAGNOSIS — Z3A28 28 weeks gestation of pregnancy: Secondary | ICD-10-CM | POA: Diagnosis not present

## 2017-11-21 NOTE — L&D Delivery Note (Addendum)
Operative Delivery Note At 6:29 PM a viable and healthy female was delivered via Vaginal, Spontaneous.  Presentation: vertex; Position: Right,, Occiput,, Posterior; Station: +2.  Verbal consent: obtained from patient.  Risks and benefits discussed in detail.  Risks include, but are not limited to the risks of anesthesia, bleeding, infection, damage to maternal tissues, fetal cephalhematoma.  There is also the risk of inability to effect vaginal delivery of the head, or shoulder dystocia that cannot be resolved by established maneuvers, leading to the need for emergency cesarean section.  APGAR: 5, 8; weight  .  pending Placenta status: spont intact, schultz, .   Cord:  with the following complications: .  Cord pH: 7.11 Vertex rotated spontaneously from ROP to ROA during crowning. Anesthesia:  epidural Instruments: Kiwi Episiotomy: None Lacerations: 1st degree perineal and periurethral Suture Repair:  Est. Blood Loss (mL): 250  Mom to postpartum.  Baby to Couplet care / Skin to Skin after transport to warmer at 45 secs for tactile stim and blow by O2. Generous NG suctioning of thick mucus done on Warmer with good response. .Assisted in Delivery by Alison Murrayaroline Mc Neil.  Tilda BurrowJohn V Calum Cormier 02/07/2018, 6:44 PM

## 2017-11-23 ENCOUNTER — Encounter: Payer: Medicaid Other | Admitting: Obstetrics and Gynecology

## 2017-11-27 ENCOUNTER — Ambulatory Visit (INDEPENDENT_AMBULATORY_CARE_PROVIDER_SITE_OTHER): Payer: Medicaid Other | Admitting: Family Medicine

## 2017-11-27 VITALS — BP 114/64 | HR 112 | Wt 156.0 lb

## 2017-11-27 DIAGNOSIS — Z029 Encounter for administrative examinations, unspecified: Secondary | ICD-10-CM

## 2017-11-27 DIAGNOSIS — O9989 Other specified diseases and conditions complicating pregnancy, childbirth and the puerperium: Secondary | ICD-10-CM | POA: Diagnosis not present

## 2017-11-27 DIAGNOSIS — M549 Dorsalgia, unspecified: Secondary | ICD-10-CM

## 2017-11-27 DIAGNOSIS — D649 Anemia, unspecified: Secondary | ICD-10-CM

## 2017-11-27 DIAGNOSIS — Z3480 Encounter for supervision of other normal pregnancy, unspecified trimester: Secondary | ICD-10-CM

## 2017-11-27 DIAGNOSIS — O99013 Anemia complicating pregnancy, third trimester: Secondary | ICD-10-CM

## 2017-11-27 DIAGNOSIS — O99891 Other specified diseases and conditions complicating pregnancy: Secondary | ICD-10-CM

## 2017-11-27 DIAGNOSIS — M9903 Segmental and somatic dysfunction of lumbar region: Secondary | ICD-10-CM

## 2017-11-27 MED ORDER — FERROUS SULFATE 325 (65 FE) MG PO TABS: 325.0000 mg | ORAL_TABLET | Freq: Two times a day (BID) | ORAL | 1 refills | Status: DC

## 2017-11-27 MED ORDER — PREPLUS 27-1 MG PO TABS: 1.0000 | ORAL_TABLET | Freq: Every day | ORAL | 3 refills | Status: DC

## 2017-11-27 MED ORDER — CYCLOBENZAPRINE HCL 10 MG PO TABS: 10.0000 mg | ORAL_TABLET | Freq: Three times a day (TID) | ORAL | 2 refills | Status: DC | PRN

## 2017-11-27 NOTE — Progress Notes (Signed)
Declines tdap 

## 2017-11-27 NOTE — Progress Notes (Signed)
   PRENATAL VISIT NOTE  Subjective:  Lowanda FosterBeverly Sockwell is a 22 y.o. G2P1001 at 8046w5d being seen today for ongoing prenatal care.  She is currently monitored for the following issues for this low-risk pregnancy and has Supervision of other normal pregnancy, antepartum on their problem list.  Patient reports left back pain with radiation down to foot. Worse when laying in bed, sitting for long time. Pain with transition from sitting to standing. Some numbness intermittently, especially with prolonged sitting..  Contractions: Irregular. Vag. Bleeding: None.  Movement: Present. Denies leaking of fluid.   The following portions of the patient's history were reviewed and updated as appropriate: allergies, current medications, past family history, past medical history, past social history, past surgical history and problem list. Problem list updated.  Objective:   Vitals:   11/27/17 1014  BP: 114/64  Pulse: (!) 112  Weight: 156 lb (70.8 kg)    Fetal Status: Fetal Heart Rate (bpm): 151   Movement: Present     General:  Alert, oriented and cooperative. Patient is in no acute distress.  Skin: Skin is warm and dry. No rash noted.   Cardiovascular: Normal heart rate noted  Respiratory: Normal respiratory effort, no problems with respiration noted  Abdomen: Soft, gravid, appropriate for gestational age. Pain/Pressure: Present     Pelvic:  Cervical exam deferred        MSK: Restriction, tenderness, tissue texture changes, and paraspinal spasm in the left lumbar spine  Neuro: Moves all four extremities with no focal neurological deficit  Extremities: Normal range of motion.  Edema: None  Mental Status: Normal mood and affect. Normal behavior. Normal judgment and thought content.   OSE: Head   Cervical   Thoracic   Rib   Lumbar L1 ESRR, L5 ESRL  Sacrum L/L  Pelvis Right ant innom    Assessment and Plan:  Pregnancy: G2P1001 at 346w5d  1. Supervision of other normal pregnancy, antepartum FHT  and FH normal - Glucose Tolerance, 2 Hours w/1 Hour - CBC - RPR - HIV antibody - Prenatal Vit-Fe Fumarate-FA (PREPLUS) 27-1 MG TABS; Take 1 tablet by mouth daily.  Dispense: 30 tablet; Refill: 3  2. Anemia in pregnancy, third trimester Iron prescribed  3. Back pain affecting pregnancy in third trimester 4. Somatic dysfunction of lumbar region OMT done after patient permission. HVLA technique utilized. 3 areas treated with improvement of tissue texture and joint mobility. Patient tolerated procedure well. Flexeril prescribed. Stretches demonstrated and taught.    Preterm labor symptoms and general obstetric precautions including but not limited to vaginal bleeding, contractions, leaking of fluid and fetal movement were reviewed in detail with the patient. Please refer to After Visit Summary for other counseling recommendations.  No Follow-up on file.  Levie HeritageStinson, Ashle Stief J, DO

## 2017-11-28 LAB — CBC
Hematocrit: 27.6 % — ABNORMAL LOW (ref 34.0–46.6)
Hemoglobin: 8.9 g/dL — ABNORMAL LOW (ref 11.1–15.9)
MCH: 25.9 pg — ABNORMAL LOW (ref 26.6–33.0)
MCHC: 32.2 g/dL (ref 31.5–35.7)
MCV: 80 fL (ref 79–97)
PLATELETS: 309 10*3/uL (ref 150–379)
RBC: 3.44 x10E6/uL — AB (ref 3.77–5.28)
RDW: 16 % — ABNORMAL HIGH (ref 12.3–15.4)
WBC: 8.5 10*3/uL (ref 3.4–10.8)

## 2017-11-28 LAB — GLUCOSE TOLERANCE, 2 HOURS W/ 1HR
GLUCOSE, 1 HOUR: 140 mg/dL (ref 65–179)
GLUCOSE, FASTING: 83 mg/dL (ref 65–91)
Glucose, 2 hour: 121 mg/dL (ref 65–152)

## 2017-11-28 LAB — RPR: RPR: NONREACTIVE

## 2017-11-28 LAB — HIV ANTIBODY (ROUTINE TESTING W REFLEX): HIV SCREEN 4TH GENERATION: NONREACTIVE

## 2017-12-11 ENCOUNTER — Ambulatory Visit (INDEPENDENT_AMBULATORY_CARE_PROVIDER_SITE_OTHER): Payer: Medicaid Other | Admitting: Advanced Practice Midwife

## 2017-12-11 VITALS — BP 123/67 | HR 105 | Wt 159.6 lb

## 2017-12-11 DIAGNOSIS — Z3483 Encounter for supervision of other normal pregnancy, third trimester: Secondary | ICD-10-CM

## 2017-12-11 DIAGNOSIS — Z3480 Encounter for supervision of other normal pregnancy, unspecified trimester: Secondary | ICD-10-CM

## 2017-12-11 DIAGNOSIS — N9089 Other specified noninflammatory disorders of vulva and perineum: Secondary | ICD-10-CM

## 2017-12-11 MED ORDER — VALACYCLOVIR HCL 1 G PO TABS: 1000.0000 mg | ORAL_TABLET | Freq: Every day | ORAL | 0 refills | Status: DC

## 2017-12-11 NOTE — Addendum Note (Signed)
Addended by: Mikey BussingWILSON, CHIQUITA L on: 12/11/2017 03:29 PM   Modules accepted: Orders

## 2017-12-11 NOTE — Addendum Note (Signed)
Addended by: Thressa ShellerHOGAN, Nalina Yeatman D on: 12/11/2017 03:24 PM   Modules accepted: Orders

## 2017-12-11 NOTE — Progress Notes (Signed)
   PRENATAL VISIT NOTE  Subjective:  Nancy FosterBeverly Lazcano is a 22 y.o. G2P1001 at 6534w5d being seen today for ongoing prenatal care.  She is currently monitored for the following issues for this low-risk pregnancy and has Supervision of other normal pregnancy, antepartum and Anemia in pregnancy, third trimester on their problem list.  Patient reports "scratch on labia". She states that it comes and goes, and she has to be careful when she wipes because it will cause irritation. Contractions: Irregular. Vag. Bleeding: None.  Movement: Present. Denies leaking of fluid.   The following portions of the patient's history were reviewed and updated as appropriate: allergies, current medications, past family history, past medical history, past social history, past surgical history and problem list. Problem list updated.  Objective:   Vitals:   12/11/17 1447  BP: 123/67  Pulse: (!) 105  Weight: 159 lb 9.6 oz (72.4 kg)    Fetal Status: Fetal Heart Rate (bpm): 149 Fundal Height: 32 cm Movement: Present     General:  Alert, oriented and cooperative. Patient is in no acute distress.  Skin: Skin is warm and dry. No rash noted.   Cardiovascular: Normal heart rate noted  Respiratory: Normal respiratory effort, no problems with respiration noted  Abdomen: Soft, gravid, appropriate for gestational age.  Pain/Pressure: Present     Pelvic: Cervical exam deferred       Herpatic appearing lesion at the introitus.   Extremities: Normal range of motion.  Edema: Trace  Mental Status:  Normal mood and affect. Normal behavior. Normal judgment and thought content.   Assessment and Plan:  Pregnancy: G2P1001 at 6034w5d  1. Supervision of other normal pregnancy, antepartum - Routine care - HSV DNA PCR today  - Valtrex 1g PO QD x 5 days sent to pharmacy - DW  Patient that will need suppression after this episode clears.   Term labor symptoms and general obstetric precautions including but not limited to vaginal  bleeding, contractions, leaking of fluid and fetal movement were reviewed in detail with the patient. Please refer to After Visit Summary for other counseling recommendations.  Return in about 2 weeks (around 12/25/2017).   Thressa ShellerHeather Hogan, CNM

## 2017-12-11 NOTE — Progress Notes (Signed)
Pt states is having pain in Vagina,offered Tdap and pt refused.

## 2017-12-11 NOTE — Addendum Note (Signed)
Addended by: Lorelle GibbsWILSON, Talayah Picardi L on: 12/11/2017 03:20 PM   Modules accepted: Orders

## 2017-12-13 ENCOUNTER — Encounter: Payer: Self-pay | Admitting: Advanced Practice Midwife

## 2017-12-13 LAB — HERPES SIMPLEX VIRUS(HSV) DNA BY PCR
HSV 2 DNA: NEGATIVE
HSV-1 DNA: NEGATIVE

## 2017-12-25 ENCOUNTER — Ambulatory Visit (INDEPENDENT_AMBULATORY_CARE_PROVIDER_SITE_OTHER): Payer: Medicaid Other | Admitting: Advanced Practice Midwife

## 2017-12-25 ENCOUNTER — Encounter: Payer: Self-pay | Admitting: Advanced Practice Midwife

## 2017-12-25 VITALS — BP 107/59 | HR 116 | Wt 167.8 lb

## 2017-12-25 DIAGNOSIS — Z3480 Encounter for supervision of other normal pregnancy, unspecified trimester: Secondary | ICD-10-CM

## 2017-12-25 NOTE — Progress Notes (Signed)
Tdap declined on 12/25/17

## 2017-12-25 NOTE — Progress Notes (Signed)
   PRENATAL VISIT NOTE  Subjective:  Nancy Hess is a 22 y.o. G2P1001 at 10213w5d being seen today for ongoing prenatal care.  She is currently monitored for the following issues for this low-risk pregnancy and has Supervision of other normal pregnancy, antepartum and Anemia in pregnancy, third trimester on their problem list.  Patient reports no complaints.  Contractions: Irregular. Vag. Bleeding: None.  Movement: Present. Denies leaking of fluid.   The following portions of the patient's history were reviewed and updated as appropriate: allergies, current medications, past family history, past medical history, past social history, past surgical history and problem list. Problem list updated.  Objective:   Vitals:   12/25/17 0930  BP: (!) 107/59  Pulse: (!) 116  Weight: 167 lb 12.8 oz (76.1 kg)    Fetal Status: Fetal Heart Rate (bpm): 150 Fundal Height: 34 cm Movement: Present     General:  Alert, oriented and cooperative. Patient is in no acute distress.  Skin: Skin is warm and dry. No rash noted.   Cardiovascular: Normal heart rate noted  Respiratory: Normal respiratory effort, no problems with respiration noted  Abdomen: Soft, gravid, appropriate for gestational age.  Pain/Pressure: Present     Pelvic: Cervical exam deferred        Extremities: Normal range of motion.  Edema: Mild pitting, slight indentation  Mental Status:  Normal mood and affect. Normal behavior. Normal judgment and thought content.   Assessment and Plan:  Pregnancy: G2P1001 at 4213w5d  1. Supervision of other normal pregnancy, antepartum - HSV PCR from last visit was negative - Patient interested in waterbirth, she had one her last baby. Info given on class  Preterm labor symptoms and general obstetric precautions including but not limited to vaginal bleeding, contractions, leaking of fluid and fetal movement were reviewed in detail with the patient. Please refer to After Visit Summary for other counseling  recommendations.  Return in about 2 weeks (around 01/08/2018).   Thressa ShellerHeather Sama Arauz, CNM

## 2017-12-25 NOTE — Patient Instructions (Addendum)
Considering Waterbirth? Guide for patients at [facility]  Why consider waterbirth?  . Gentle birth for babies . Less pain medicine used in labor . May allow for passive descent/less pushing . May reduce perineal tears  . More mobility and instinctive maternal position changes . Increased maternal relaxation . Reduced blood pressure in labor  Is waterbirth safe? What are the risks of infection, drowning or other complications?  . Infection: o Very low risk (3.7 % for tub vs 4.8% for bed) o 7 in 8000 waterbirths with documented infection o Poorly cleaned equipment most common cause o Slightly lower group B strep transmission rate  . Drowning o Maternal:  - Very low risk   - Related to seizures or fainting o Newborn:  - Very low risk. No evidence of increased risk of respiratory problems in multiple large studies - Physiological protection from breathing under water - Avoid underwater birth if there are any fetal complications - Once baby's head is out of the water, keep it out.  . Birth complication o Some reports of cord trauma, but risk decreased by bringing baby to surface gradually o No evidence of increased risk of shoulder dystocia. Mothers can usually change positions faster in water than in a bed, possibly aiding the maneuvers to free the shoulder.   You must attend a Doren Custard class at [facility]  3rd Wednesday of every month from 7-9pm  [employer] by calling [phone] or online at [email or web address]  Bring [place] the certificate from the class to your prenatal appointment  Meet with a midwife at 36 weeks to see if you can still plan a waterbirth and to sign the consent.   Purchase or rent the following supplies:   Water Birth Pool (Birth Pool in a Box or [place] for instance)  (Tubs start ~$125)  Single-use disposable tub liner designed for your brand of tub  New garden hose labeled "lead-free", "suitable for drinking  water",  Electric drain pump to remove water (We recommend 792 gallon per hour or greater pump.)   Separate garden hose to remove the dirty water  Fish net  Bathing suit top (optional)  Long-handled mirror (optional)  Places to purchase or rent supplies  GotWeb[employer].is for tub purchases and supplies  Waterbirthsolutions.com for tub purchases and supplies  The Labor Ladies (www.thelaborladies.com) $275 for tub rental/set-up & take down/kit   [employer] (http://www.fleming.com/.htm) Information regarding doulas (labor support) who provide pool rentals  Our practice has a Birth Pool in a Box tub at the hospital that you may borrow on a first-come-first-served basis. It is your responsibility to to set up, clean and break down the tub. We cannot guarantee the availability of this tub in advance. You are responsible for bringing all accessories listed above. If you do not have all necessary supplies you cannot have a waterbirth.    Things that would prevent you from having a waterbirth:  Premature, <37wks  Previous cesarean birth  Presence of thick meconium-stained fluid  Multiple gestation (Twins, triplets, etc.)  Uncontrolled diabetes or gestational diabetes requiring medication  Hypertension requiring medication or diagnosis of pre-eclampsia  Heavy vaginal bleeding  Non-reassuring fetal heart rate  Active infection (MRSA, etc.). Group B Strep is NOT a contraindication for  waterbirth.  If your labor has to be induced and induction method requires continuous  monitoring of the baby's heart rate  Other risks/issues identified by your obstetrical provider  Please remember that birth is unpredictable. Under certain unforeseeable  circumstances your provider may advise against giving birth in the tub. These decisions will be made on a case-by-case basis and with the safety of you and your baby as our highest priority.

## 2017-12-26 ENCOUNTER — Encounter: Payer: Self-pay | Admitting: Advanced Practice Midwife

## 2017-12-27 ENCOUNTER — Encounter: Payer: Self-pay | Admitting: *Deleted

## 2017-12-27 ENCOUNTER — Encounter: Payer: Self-pay | Admitting: Advanced Practice Midwife

## 2018-01-08 ENCOUNTER — Ambulatory Visit: Payer: Medicaid Other | Admitting: Advanced Practice Midwife

## 2018-01-08 ENCOUNTER — Other Ambulatory Visit (HOSPITAL_COMMUNITY)
Admission: RE | Admit: 2018-01-08 | Discharge: 2018-01-08 | Disposition: A | Discharge status: Home / Self Care | Payer: Medicaid Other | Source: Ambulatory Visit | Attending: Advanced Practice Midwife | Admitting: Advanced Practice Midwife

## 2018-01-08 ENCOUNTER — Encounter: Payer: Self-pay | Admitting: Advanced Practice Midwife

## 2018-01-08 VITALS — BP 123/63 | HR 115 | Wt 171.0 lb

## 2018-01-08 DIAGNOSIS — Z3480 Encounter for supervision of other normal pregnancy, unspecified trimester: Secondary | ICD-10-CM

## 2018-01-08 DIAGNOSIS — Z3483 Encounter for supervision of other normal pregnancy, third trimester: Secondary | ICD-10-CM | POA: Insufficient documentation

## 2018-01-08 LAB — OB RESULTS CONSOLE GBS: STREP GROUP B AG: NEGATIVE

## 2018-01-08 NOTE — Patient Instructions (Addendum)
Labor Ladies: [phone]  Vaginal delivery means that you will give birth by pushing your baby out of your birth canal (vagina). A team of health care providers will help you before, during, and after vaginal delivery. Birth experiences are unique for every woman and every pregnancy, and birth experiences vary depending on where you choose to give birth. What should I do to prepare for my baby's birth? Before your baby is born, it is important to talk with your health care provider about:  Your labor and delivery preferences. These may include: ? Medicines that you may be given. ? How you will manage your pain. This might include non-medical pain relief techniques or injectable pain relief such as epidural analgesia. ? How you and your baby will be monitored during labor and delivery. ? Who may be in the labor and delivery room with you. ? Your feelings about surgical delivery of your baby (cesarean delivery, or C-section) if this becomes necessary. ? Your feelings about receiving donated blood through an IV tube (blood transfusion) if this becomes necessary.  Whether you are able: ? To take pictures or videos of the birth. ? To eat during labor and delivery. ? To move around, walk, or change positions during labor and delivery.  What to expect after your baby is born, such as: ? Whether delayed umbilical cord clamping and cutting is offered. ? Who will care for your baby right after birth. ? Medicines or tests that may be recommended for your baby. ? Whether breastfeeding is supported in your hospital or birth center. ? How long you will be in the hospital or birth center.  How any medical conditions you have may affect your baby or your labor and delivery experience.  To prepare for your baby's birth, you should also:  Attend all of your health care visits before delivery (prenatal visits) as recommended by your health care provider. This is important.  Prepare your home for your  baby's arrival. Make sure that you have: ? Diapers. ? Baby clothing. ? Feeding equipment. ? Safe sleeping arrangements for you and your baby.  Install a car seat in your vehicle. Have your car seat checked by a certified car seat installer to make sure that it is installed safely.  Think about who will help you with your new baby at home for at least the first several weeks after delivery.  What can I expect when I arrive at the birth center or hospital? Once you are in labor and have been admitted into the hospital or birth center, your health care provider may:  Review your pregnancy history and any concerns you have.  Insert an IV tube into one of your veins. This is used to give you fluids and medicines.  Check your blood pressure, pulse, temperature, and heart rate (vital signs).  Check whether your bag of water (amniotic sac) has broken (ruptured).  Talk with you about your birth plan and discuss pain control options.  Monitoring Your health care provider may monitor your contractions (uterine monitoring) and your baby's heart rate (fetal monitoring). You may need to be monitored:  Often, but not continuously (intermittently).  All the time or for long periods at a time (continuously). Continuous monitoring may be needed if: ? You are taking certain medicines, such as medicine to relieve pain or make your contractions stronger. ? You have pregnancy or labor complications.  Monitoring may be done by:  Placing a special stethoscope or a handheld monitoring device on your  abdomen to check your baby's heartbeat, and feeling your abdomen for contractions. This method of monitoring does not continuously record your baby's heartbeat or your contractions.  Placing monitors on your abdomen (external monitors) to record your baby's heartbeat and the frequency and length of contractions. You may not have to wear external monitors all the time.  Placing monitors inside of your uterus  (internal monitors) to record your baby's heartbeat and the frequency, length, and strength of your contractions. ? Your health care provider may use internal monitors if he or she needs more information about the strength of your contractions or your baby's heart rate. ? Internal monitors are put in place by passing a thin, flexible wire through your vagina and into your uterus. Depending on the type of monitor, it may remain in your uterus or on your baby's head until birth. ? Your health care provider will discuss the benefits and risks of internal monitoring with you and will ask for your permission before inserting the monitors.  Telemetry. This is a type of continuous monitoring that can be done with external or internal monitors. Instead of having to stay in bed, you are able to move around during telemetry. Ask your health care provider if telemetry is an option for you.  Physical exam Your health care provider may perform a physical exam. This may include:  Checking whether your baby is positioned: ? With the head toward your vagina (head-down). This is most common. ? With the head toward the top of your uterus (head-up or breech). If your baby is in a breech position, your health care provider may try to turn your baby to a head-down position so you can deliver vaginally. If it does not seem that your baby can be born vaginally, your provider may recommend surgery to deliver your baby. In rare cases, you may be able to deliver vaginally if your baby is head-up (breech delivery). ? Lying sideways (transverse). Babies that are lying sideways cannot be delivered vaginally.  Checking your cervix to determine: ? Whether it is thinning out (effacing). ? Whether it is opening up (dilating). ? How low your baby has moved into your birth canal.  What are the three stages of labor and delivery?  Normal labor and delivery is divided into the following three stages: Stage 1  Stage 1 is the  longest stage of labor, and it can last for hours or days. Stage 1 includes: ? Early labor. This is when contractions may be irregular, or regular and mild. Generally, early labor contractions are more than 10 minutes apart. ? Active labor. This is when contractions get longer, more regular, more frequent, and more intense. ? The transition phase. This is when contractions happen very close together, are very intense, and may last longer than during any other part of labor.  Contractions generally feel mild, infrequent, and irregular at first. They get stronger, more frequent (about every 2-3 minutes), and more regular as you progress from early labor through active labor and transition.  Many women progress through stage 1 naturally, but you may need help to continue making progress. If this happens, your health care provider may talk with you about: ? Rupturing your amniotic sac if it has not ruptured yet. ? Giving you medicine to help make your contractions stronger and more frequent.  Stage 1 ends when your cervix is completely dilated to 4 inches (10 cm) and completely effaced. This happens at the end of the transition phase. Stage 2  your cervix is completely effaced and dilated to 4 inches (10 cm), you may start to feel an urge to push. It is common for the body to naturally take a rest before feeling the urge to push, especially if you received an epidural or certain other pain medicines. This rest period may last for up to 1-2 hours, depending on your unique labor experience.  During stage 2, contractions are generally less painful, because pushing helps relieve contraction pain. Instead of contraction pain, you may feel stretching and burning pain, especially when the widest part of your baby's head passes through the vaginal opening (crowning).  Your health care provider will closely monitor your pushing progress and your baby's progress through the vagina during stage 2.  Your  health care provider may massage the area of skin between your vaginal opening and anus (perineum) or apply warm compresses to your perineum. This helps it stretch as the baby's head starts to crown, which can help prevent perineal tearing. ? In some cases, an incision may be made in your perineum (episiotomy) to allow the baby to pass through the vaginal opening. An episiotomy helps to make the opening of the vagina larger to allow more room for the baby to fit through.  It is very important to breathe and focus so your health care provider can control the delivery of your baby's head. Your health care provider may have you decrease the intensity of your pushing, to help prevent perineal tearing.  After delivery of your baby's head, the shoulders and the rest of the body generally deliver very quickly and without difficulty.  Once your baby is delivered, the umbilical cord may be cut right away, or this may be delayed for 1-2 minutes, depending on your baby's health. This may vary among health care providers, hospitals, and birth centers.  If you and your baby are healthy enough, your baby may be placed on your chest or abdomen to help maintain the baby's temperature and to help you bond with each other. Some mothers and babies start breastfeeding at this time. Your health care team will dry your baby and help keep your baby warm during this time.  Your baby may need immediate care if he or she: ? Showed signs of distress during labor. ? Has a medical condition. ? Was born too early (prematurely). ? Had a bowel movement before birth (meconium). ? Shows signs of difficulty transitioning from being inside the uterus to being outside of the uterus. If you are planning to breastfeed, your health care team will help you begin a feeding. Stage 3  The third stage of labor starts immediately after the birth of your baby and ends after you deliver the placenta. The placenta is an organ that develops  during pregnancy to provide oxygen and nutrients to your baby in the womb.  Delivering the placenta may require some pushing, and you may have mild contractions. Breastfeeding can stimulate contractions to help you deliver the placenta.  After the placenta is delivered, your uterus should tighten (contract) and become firm. This helps to stop bleeding in your uterus. To help your uterus contract and to control bleeding, your health care provider may: ? Give you medicine by injection, through an IV tube, by mouth, or through your rectum (rectally). ? Massage your abdomen or perform a vaginal exam to remove any blood clots that are left in your uterus. ? Empty your bladder by placing a thin, flexible tube (catheter) into your bladder. ? Encourage   Encourage you to breastfeed your baby. After labor is over, you and your baby will be monitored closely to ensure that you are both healthy until you are ready to go home. Your health care team will teach you how to care for yourself and your baby. This information is not intended to replace advice given to you by your health care provider. Make sure you discuss any questions you have with your health care provider. Document Released: 08/16/2008 Document Revised: 05/27/2016 Document Reviewed: 11/22/2015 Elsevier Interactive Patient Education  2018 [employer]

## 2018-01-08 NOTE — Progress Notes (Signed)
   PRENATAL VISIT NOTE  Subjective:  Nancy Hess is a 22 y.o. G2P1001 at 4083w5d being seen today for ongoing prenatal care.  She is currently monitored for the following issues for this low-risk pregnancy and has Supervision of other normal pregnancy, antepartum and Anemia in pregnancy, third trimester on their problem list.  Patient reports no complaints.  Contractions: Irregular. Vag. Bleeding: None.  Movement: Present. Denies leaking of fluid.   The following portions of the patient's history were reviewed and updated as appropriate: allergies, current medications, past family history, past medical history, past social history, past surgical history and problem list. Problem list updated.  Objective:   Vitals:   01/08/18 0913  BP: 123/63  Pulse: (!) 115  Weight: 171 lb (77.6 kg)    Fetal Status: Fetal Heart Rate (bpm): 148 Fundal Height: 37 cm Movement: Present     General:  Alert, oriented and cooperative. Patient is in no acute distress.  Skin: Skin is warm and dry. No rash noted.   Cardiovascular: Normal heart rate noted  Respiratory: Normal respiratory effort, no problems with respiration noted  Abdomen: Soft, gravid, appropriate for gestational age.  Pain/Pressure: Present     Pelvic: Cervical exam performed        Extremities: Normal range of motion.  Edema: Trace  Mental Status:  Normal mood and affect. Normal behavior. Normal judgment and thought content.   Assessment and Plan:  Pregnancy: G2P1001 at 6583w5d  1. Supervision of other normal pregnancy, antepartum  - Culture, beta strep (group b only) - Cervicovaginal ancillary only - Planning waterbirth. Class scheduled for 01/10/18  Term labor symptoms and general obstetric precautions including but not limited to vaginal bleeding, contractions, leaking of fluid and fetal movement were reviewed in detail with the patient. Please refer to After Visit Summary for other counseling recommendations.  Return in about 1 week  (around 01/15/2018).   Thressa ShellerHeather Azyriah Nevins, CNM

## 2018-01-09 LAB — GC/CHLAMYDIA PROBE AMP (~~LOC~~) NOT AT ARMC
Chlamydia: NEGATIVE
Neisseria Gonorrhea: NEGATIVE

## 2018-01-12 LAB — CULTURE, BETA STREP (GROUP B ONLY): Strep Gp B Culture: NEGATIVE

## 2018-01-15 ENCOUNTER — Encounter: Payer: Self-pay | Admitting: *Deleted

## 2018-01-15 ENCOUNTER — Encounter: Payer: Self-pay | Admitting: Advanced Practice Midwife

## 2018-01-15 ENCOUNTER — Ambulatory Visit (INDEPENDENT_AMBULATORY_CARE_PROVIDER_SITE_OTHER): Payer: Medicaid Other | Admitting: Advanced Practice Midwife

## 2018-01-15 VITALS — BP 115/67 | HR 114 | Wt 172.0 lb

## 2018-01-15 DIAGNOSIS — Z029 Encounter for administrative examinations, unspecified: Secondary | ICD-10-CM

## 2018-01-15 DIAGNOSIS — Z3480 Encounter for supervision of other normal pregnancy, unspecified trimester: Secondary | ICD-10-CM

## 2018-01-15 NOTE — Progress Notes (Signed)
   PRENATAL VISIT NOTE  Subjective:  Nancy FosterBeverly Farrelly is a 22 y.o. G2P1001 at [redacted]w[redacted]d being seen today for ongoing prenatal care.  She is currently monitored for the following issues for this low-risk pregnancy and has Supervision of other normal pregnancy, antepartum and Anemia in pregnancy, third trimester on their problem list.  Patient reports no complaints.  Contractions: Irregular. Vag. Bleeding: None.  Movement: Present. Denies leaking of fluid.   The following portions of the patient's history were reviewed and updated as appropriate: allergies, current medications, past family history, past medical history, past social history, past surgical history and problem list. Problem list updated.  Objective:   Vitals:   01/15/18 1032  BP: 115/67  Pulse: (!) 114  Weight: 172 lb (78 kg)    Fetal Status: Fetal Heart Rate (bpm): 149 Fundal Height: 38 cm Movement: Present     General:  Alert, oriented and cooperative. Patient is in no acute distress.  Skin: Skin is warm and dry. No rash noted.   Cardiovascular: Normal heart rate noted  Respiratory: Normal respiratory effort, no problems with respiration noted  Abdomen: Soft, gravid, appropriate for gestational age.  Pain/Pressure: Present     Pelvic: Cervical exam deferred Dilation: Closed Effacement (%): Thick Station: -3  Extremities: Normal range of motion.  Edema: Trace  Mental Status:  Normal mood and affect. Normal behavior. Normal judgment and thought content.   Assessment and Plan:  Pregnancy: G2P1001 at 1029w5d  1. Supervision of other normal pregnancy, antepartum - Routine care - Planning waterbirth, class done, consent signed today   Term labor symptoms and general obstetric precautions including but not limited to vaginal bleeding, contractions, leaking of fluid and fetal movement were reviewed in detail with the patient. Please refer to After Visit Summary for other counseling recommendations.  Return in about 1 week (around  01/22/2018).   Thressa ShellerHeather Kirsten Mckone, CNM

## 2018-01-22 ENCOUNTER — Encounter: Payer: Self-pay | Admitting: Advanced Practice Midwife

## 2018-01-22 ENCOUNTER — Ambulatory Visit (INDEPENDENT_AMBULATORY_CARE_PROVIDER_SITE_OTHER): Payer: Medicaid Other | Admitting: Advanced Practice Midwife

## 2018-01-22 VITALS — BP 112/67 | HR 95 | Wt 172.0 lb

## 2018-01-22 DIAGNOSIS — Z3480 Encounter for supervision of other normal pregnancy, unspecified trimester: Secondary | ICD-10-CM

## 2018-01-22 NOTE — Progress Notes (Signed)
   PRENATAL VISIT NOTE  Subjective:  Nancy Hess is a 22 y.o. G2P1001 at 6123w5d being seen today for ongoing prenatal care.  She is currently monitored for the following issues for this low-risk pregnancy and has Supervision of other normal pregnancy, antepartum and Anemia in pregnancy, third trimester on their problem list.  Patient reports no complaints.  Contractions: Irregular. Vag. Bleeding: None.  Movement: Present. Denies leaking of fluid.   The following portions of the patient's history were reviewed and updated as appropriate: allergies, current medications, past family history, past medical history, past social history, past surgical history and problem list. Problem list updated.  Objective:   Vitals:   01/22/18 0932  BP: 112/67  Pulse: 95  Weight: 172 lb (78 kg)    Fetal Status: Fetal Heart Rate (bpm): 143 Fundal Height: 39 cm Movement: Present     General:  Alert, oriented and cooperative. Patient is in no acute distress.  Skin: Skin is warm and dry. No rash noted.   Cardiovascular: Normal heart rate noted  Respiratory: Normal respiratory effort, no problems with respiration noted  Abdomen: Soft, gravid, appropriate for gestational age.  Pain/Pressure: Absent     Pelvic: Cervical exam deferred        Extremities: Normal range of motion.  Edema: Trace  Mental Status:  Normal mood and affect. Normal behavior. Normal judgment and thought content.   Assessment and Plan:  Pregnancy: G2P1001 at 2023w5d  1. Supervision of other normal pregnancy, antepartum - Routine care - postdated management discussed with the patient today   Term labor symptoms and general obstetric precautions including but not limited to vaginal bleeding, contractions, leaking of fluid and fetal movement were reviewed in detail with the patient. Please refer to After Visit Summary for other counseling recommendations.  Return in about 1 week (around 01/29/2018).   Thressa ShellerHeather Hogan, CNM

## 2018-01-29 ENCOUNTER — Ambulatory Visit (INDEPENDENT_AMBULATORY_CARE_PROVIDER_SITE_OTHER): Payer: Medicaid Other | Admitting: Advanced Practice Midwife

## 2018-01-29 ENCOUNTER — Encounter: Payer: Self-pay | Admitting: *Deleted

## 2018-01-29 ENCOUNTER — Encounter: Payer: Self-pay | Admitting: Advanced Practice Midwife

## 2018-01-29 VITALS — BP 107/66 | HR 114 | Wt 177.2 lb

## 2018-01-29 DIAGNOSIS — Z3480 Encounter for supervision of other normal pregnancy, unspecified trimester: Secondary | ICD-10-CM

## 2018-01-29 NOTE — Progress Notes (Signed)
   PRENATAL VISIT NOTE  Subjective:  Nancy FosterBeverly Hess is a 22 y.o. G2P1001 at 4747w5d being seen today for ongoing prenatal care.  She is currently monitored for the following issues for this low-risk pregnancy and has Supervision of other normal pregnancy, antepartum and Anemia in pregnancy, third trimester on their problem list.  Patient reports no complaints.  Contractions: Irregular. Vag. Bleeding: None.  Movement: Present. Denies leaking of fluid.   The following portions of the patient's history were reviewed and updated as appropriate: allergies, current medications, past family history, past medical history, past social history, past surgical history and problem list. Problem list updated.  Objective:   Vitals:   01/29/18 1019  BP: 107/66  Pulse: (!) 114  Weight: 177 lb 3.2 oz (80.4 kg)    Fetal Status: Fetal Heart Rate (bpm): 150 Fundal Height: 41 cm Movement: Present  Presentation: Vertex  General:  Alert, oriented and cooperative. Patient is in no acute distress.  Skin: Skin is warm and dry. No rash noted.   Cardiovascular: Normal heart rate noted  Respiratory: Normal respiratory effort, no problems with respiration noted  Abdomen: Soft, gravid, appropriate for gestational age.  Pain/Pressure: Absent     Pelvic: Cervical exam performed Dilation: 2 Effacement (%): Thick Station: -3  Extremities: Normal range of motion.  Edema: Trace  Mental Status:  Normal mood and affect. Normal behavior. Normal judgment and thought content.   Assessment and Plan:  Pregnancy: G2P1001 at 3147w5d  1. Supervision of other normal pregnancy, antepartum - Routine care - BPP/NST for 3/15 or 3/18 depending on schedule -  IOL for 02/07/18 @7 :30 if no labor prior to next appt.  -  IOL orders placed   Term labor symptoms and general obstetric precautions including but not limited to vaginal bleeding, contractions, leaking of fluid and fetal movement were reviewed in detail with the patient. Please  refer to After Visit Summary for other counseling recommendations.  Return in about 1 week (around 02/05/2018).   Thressa ShellerHeather Hogan, CNM

## 2018-01-29 NOTE — Progress Notes (Signed)
IOL scheduled for 01/10/09

## 2018-01-30 ENCOUNTER — Telehealth (HOSPITAL_COMMUNITY): Payer: Self-pay | Admitting: *Deleted

## 2018-01-30 NOTE — Telephone Encounter (Signed)
Preadmission screen  

## 2018-02-01 ENCOUNTER — Inpatient Hospital Stay (HOSPITAL_COMMUNITY)
Admission: AD | Admit: 2018-02-01 | Discharge: 2018-02-01 | Disposition: A | Discharge status: Home / Self Care | Payer: Medicaid Other | Source: Ambulatory Visit | Attending: Obstetrics and Gynecology | Admitting: Obstetrics and Gynecology

## 2018-02-01 ENCOUNTER — Encounter (HOSPITAL_COMMUNITY): Payer: Self-pay | Admitting: *Deleted

## 2018-02-01 ENCOUNTER — Other Ambulatory Visit: Payer: Self-pay

## 2018-02-01 DIAGNOSIS — O479 False labor, unspecified: Secondary | ICD-10-CM

## 2018-02-01 DIAGNOSIS — Z3A39 39 weeks gestation of pregnancy: Secondary | ICD-10-CM | POA: Diagnosis not present

## 2018-02-01 DIAGNOSIS — Z3480 Encounter for supervision of other normal pregnancy, unspecified trimester: Secondary | ICD-10-CM

## 2018-02-01 DIAGNOSIS — Z3483 Encounter for supervision of other normal pregnancy, third trimester: Secondary | ICD-10-CM | POA: Insufficient documentation

## 2018-02-01 NOTE — MAU Note (Signed)
Pt. Here due to vaginal pressure, denies sudden gush of fluid, no vaginal bleeding, positive for fetal movement.  EFM applied - FHR - 150s, Toco applied - abd. Soft.

## 2018-02-01 NOTE — MAU Note (Signed)
..   I have communicated with Dr. Rachelle HoraMoss and reviewed vital signs:  Vitals:   02/01/18 2017 02/01/18 2035  BP:    Pulse:    Resp:    Temp:    SpO2: 100% 100%    Vaginal exam:  Dilation: 1.5 Effacement (%): Thick Station: -3 Exam by:: Melayna Robarts, RN ,   Also reviewed contraction pattern and that non-stress test is reactive.  It has been documented that patient is having occasional contractions.  SVE Thick/1.5 cm/-3. Patient denies any other complaints.  Based on this report provider has given order for discharge.  A discharge order and diagnosis entered by a provider.   Labor discharge instructions reviewed with patient.

## 2018-02-07 ENCOUNTER — Inpatient Hospital Stay (HOSPITAL_COMMUNITY): Payer: Medicaid Other | Admitting: Anesthesiology

## 2018-02-07 ENCOUNTER — Encounter (HOSPITAL_COMMUNITY): Payer: Self-pay

## 2018-02-07 ENCOUNTER — Inpatient Hospital Stay (HOSPITAL_COMMUNITY)
Admission: RE | Admit: 2018-02-07 | Discharge: 2018-02-09 | DRG: 807 | Disposition: A | Discharge status: Home / Self Care | Payer: Medicaid Other | Source: Ambulatory Visit | Attending: Obstetrics and Gynecology | Admitting: Obstetrics and Gynecology

## 2018-02-07 VITALS — BP 106/58 | HR 75 | Temp 98.4°F | Resp 18 | Ht 64.0 in | Wt 178.6 lb

## 2018-02-07 DIAGNOSIS — D649 Anemia, unspecified: Secondary | ICD-10-CM | POA: Diagnosis present

## 2018-02-07 DIAGNOSIS — O9902 Anemia complicating childbirth: Secondary | ICD-10-CM | POA: Diagnosis present

## 2018-02-07 DIAGNOSIS — Z3A41 41 weeks gestation of pregnancy: Secondary | ICD-10-CM

## 2018-02-07 DIAGNOSIS — O480 Post-term pregnancy: Secondary | ICD-10-CM | POA: Diagnosis not present

## 2018-02-07 DIAGNOSIS — Z3480 Encounter for supervision of other normal pregnancy, unspecified trimester: Secondary | ICD-10-CM

## 2018-02-07 LAB — TYPE AND SCREEN
ABO/RH(D): O POS
Antibody Screen: NEGATIVE

## 2018-02-07 LAB — CBC
HCT: 30 % — ABNORMAL LOW (ref 36.0–46.0)
HEMOGLOBIN: 9.5 g/dL — AB (ref 12.0–15.0)
MCH: 24.1 pg — AB (ref 26.0–34.0)
MCHC: 31.7 g/dL (ref 30.0–36.0)
MCV: 75.9 fL — AB (ref 78.0–100.0)
Platelets: 290 10*3/uL (ref 150–400)
RBC: 3.95 MIL/uL (ref 3.87–5.11)
RDW: 18.5 % — ABNORMAL HIGH (ref 11.5–15.5)
WBC: 8.5 10*3/uL (ref 4.0–10.5)

## 2018-02-07 LAB — RPR: RPR: NONREACTIVE

## 2018-02-07 MED ORDER — LACTATED RINGERS IV SOLN: 500.0000 mL | Freq: Once | INTRAVENOUS | Status: DC

## 2018-02-07 MED ORDER — DIPHENHYDRAMINE HCL 25 MG PO CAPS: 25.0000 mg | ORAL_CAPSULE | Freq: Four times a day (QID) | ORAL | Status: DC | PRN

## 2018-02-07 MED ORDER — ZOLPIDEM TARTRATE 5 MG PO TABS: 5.0000 mg | ORAL_TABLET | Freq: Every evening | ORAL | Status: DC | PRN

## 2018-02-07 MED ORDER — OXYTOCIN 40 UNITS IN LACTATED RINGERS INFUSION - SIMPLE MED
2.5000 [IU]/h | INTRAVENOUS | Status: DC
  Filled 2018-02-07: qty 1000

## 2018-02-07 MED ORDER — EPHEDRINE 5 MG/ML INJ
10.0000 mg | INTRAVENOUS | Status: DC | PRN
  Filled 2018-02-07: qty 2

## 2018-02-07 MED ORDER — DIBUCAINE 1 % RE OINT: 1.0000 "application " | TOPICAL_OINTMENT | RECTAL | Status: DC | PRN

## 2018-02-07 MED ORDER — HYDROXYZINE HCL 50 MG PO TABS
50.0000 mg | ORAL_TABLET | Freq: Four times a day (QID) | ORAL | Status: DC | PRN
  Filled 2018-02-07: qty 1

## 2018-02-07 MED ORDER — SIMETHICONE 80 MG PO CHEW: 80.0000 mg | CHEWABLE_TABLET | ORAL | Status: DC | PRN

## 2018-02-07 MED ORDER — ONDANSETRON HCL 4 MG PO TABS: 4.0000 mg | ORAL_TABLET | ORAL | Status: DC | PRN

## 2018-02-07 MED ORDER — TERBUTALINE SULFATE 1 MG/ML IJ SOLN
0.2500 mg | Freq: Once | INTRAMUSCULAR | Status: DC | PRN
  Filled 2018-02-07: qty 1

## 2018-02-07 MED ORDER — LIDOCAINE HCL (PF) 1 % IJ SOLN
30.0000 mL | INTRAMUSCULAR | Status: DC | PRN
  Filled 2018-02-07: qty 30

## 2018-02-07 MED ORDER — FENTANYL 2.5 MCG/ML BUPIVACAINE 1/10 % EPIDURAL INFUSION (WH - ANES)
14.0000 mL/h | INTRAMUSCULAR | Status: DC | PRN
  Administered 2018-02-07: 14 mL/h via EPIDURAL
  Filled 2018-02-07: qty 100

## 2018-02-07 MED ORDER — ACETAMINOPHEN 325 MG PO TABS
650.0000 mg | ORAL_TABLET | ORAL | Status: DC | PRN
  Administered 2018-02-07 – 2018-02-08 (×2): 650 mg via ORAL
  Filled 2018-02-07 (×2): qty 2

## 2018-02-07 MED ORDER — NALBUPHINE HCL 10 MG/ML IJ SOLN: 5.0000 mg | INTRAMUSCULAR | Status: DC | PRN

## 2018-02-07 MED ORDER — SOD CITRATE-CITRIC ACID 500-334 MG/5ML PO SOLN: 30.0000 mL | ORAL | Status: DC | PRN

## 2018-02-07 MED ORDER — ACETAMINOPHEN 325 MG PO TABS: 650.0000 mg | ORAL_TABLET | ORAL | Status: DC | PRN

## 2018-02-07 MED ORDER — LACTATED RINGERS IV SOLN
INTRAVENOUS | Status: DC
  Administered 2018-02-07: 08:00:00 via INTRAVENOUS

## 2018-02-07 MED ORDER — MISOPROSTOL 50MCG HALF TABLET
50.0000 ug | ORAL_TABLET | ORAL | Status: DC | PRN
  Administered 2018-02-07 (×2): 50 ug via BUCCAL
  Filled 2018-02-07 (×3): qty 1

## 2018-02-07 MED ORDER — BENZOCAINE-MENTHOL 20-0.5 % EX AERO
1.0000 "application " | INHALATION_SPRAY | CUTANEOUS | Status: DC | PRN
  Administered 2018-02-08: 1 via TOPICAL
  Filled 2018-02-07: qty 56

## 2018-02-07 MED ORDER — LACTATED RINGERS IV SOLN: 500.0000 mL | INTRAVENOUS | Status: DC | PRN

## 2018-02-07 MED ORDER — OXYTOCIN BOLUS FROM INFUSION
500.0000 mL | Freq: Once | INTRAVENOUS | Status: AC
  Administered 2018-02-07: 500 mL via INTRAVENOUS

## 2018-02-07 MED ORDER — COCONUT OIL OIL
1.0000 "application " | TOPICAL_OIL | Status: DC | PRN
  Administered 2018-02-08: 1 via TOPICAL
  Filled 2018-02-07: qty 120

## 2018-02-07 MED ORDER — PRENATAL MULTIVITAMIN CH
1.0000 | ORAL_TABLET | Freq: Every day | ORAL | Status: DC
  Administered 2018-02-08 – 2018-02-09 (×2): 1 via ORAL
  Filled 2018-02-07 (×2): qty 1

## 2018-02-07 MED ORDER — TETANUS-DIPHTH-ACELL PERTUSSIS 5-2.5-18.5 LF-MCG/0.5 IM SUSP: 0.5000 mL | Freq: Once | INTRAMUSCULAR | Status: DC

## 2018-02-07 MED ORDER — PHENYLEPHRINE 40 MCG/ML (10ML) SYRINGE FOR IV PUSH (FOR BLOOD PRESSURE SUPPORT)
80.0000 ug | PREFILLED_SYRINGE | INTRAVENOUS | Status: DC | PRN
  Filled 2018-02-07: qty 5

## 2018-02-07 MED ORDER — OXYCODONE-ACETAMINOPHEN 5-325 MG PO TABS: 1.0000 | ORAL_TABLET | ORAL | Status: DC | PRN

## 2018-02-07 MED ORDER — WITCH HAZEL-GLYCERIN EX PADS: 1.0000 "application " | MEDICATED_PAD | CUTANEOUS | Status: DC | PRN

## 2018-02-07 MED ORDER — ONDANSETRON HCL 4 MG/2ML IJ SOLN: 4.0000 mg | INTRAMUSCULAR | Status: DC | PRN

## 2018-02-07 MED ORDER — SENNOSIDES-DOCUSATE SODIUM 8.6-50 MG PO TABS
2.0000 | ORAL_TABLET | ORAL | Status: DC
  Administered 2018-02-07 – 2018-02-08 (×2): 2 via ORAL
  Filled 2018-02-07 (×2): qty 2

## 2018-02-07 MED ORDER — OXYCODONE-ACETAMINOPHEN 5-325 MG PO TABS: 2.0000 | ORAL_TABLET | ORAL | Status: DC | PRN

## 2018-02-07 MED ORDER — LIDOCAINE HCL (PF) 1 % IJ SOLN
INTRAMUSCULAR | Status: DC | PRN
  Administered 2018-02-07 (×2): 5 mL via EPIDURAL

## 2018-02-07 MED ORDER — ONDANSETRON HCL 4 MG/2ML IJ SOLN: 4.0000 mg | Freq: Four times a day (QID) | INTRAMUSCULAR | Status: DC | PRN

## 2018-02-07 MED ORDER — IBUPROFEN 600 MG PO TABS
600.0000 mg | ORAL_TABLET | Freq: Four times a day (QID) | ORAL | Status: DC
  Administered 2018-02-07 – 2018-02-09 (×7): 600 mg via ORAL
  Filled 2018-02-07 (×7): qty 1

## 2018-02-07 MED ORDER — PHENYLEPHRINE 40 MCG/ML (10ML) SYRINGE FOR IV PUSH (FOR BLOOD PRESSURE SUPPORT)
80.0000 ug | PREFILLED_SYRINGE | INTRAVENOUS | Status: DC | PRN
  Filled 2018-02-07: qty 10
  Filled 2018-02-07: qty 5

## 2018-02-07 MED ORDER — DIPHENHYDRAMINE HCL 50 MG/ML IJ SOLN: 12.5000 mg | INTRAMUSCULAR | Status: DC | PRN

## 2018-02-07 NOTE — H&P (Signed)
OBSTETRIC ADMISSION HISTORY AND PHYSICAL  Nancy Hess is a 22 y.o. female G2P1001 with IUP at [redacted]w[redacted]d by LMP presenting for IOL for postdates. She reports +FMs, No LOF, no VB, no blurry vision, headaches or peripheral edema, and RUQ pain.  She plans on breast feeding. She requests nothing for birth control. She received her prenatal care at [facility]   Dating: By LMP --->  Estimated Date of Delivery: 01/31/18   Clinic  [facility] Prenatal Labs  Dating  LMP Blood type: --/--/O POS (12/05 2037)   Genetic Screen 1 Screen:    AFP:     Quad: too late     NIPS: Antibody:Negative (12/17 0850)  Anatomic [place]  nl 28 weeks Rubella: 1.62 (12/05 2037)  GTT Early:  N/A             Third trimester: normal RPR: Non Reactive (12/05 2037)   Flu vaccine  decline HBsAg: Negative (12/05 2037)   TDaP vaccine  decline                                  Rhogam: N/A HIV: Non Reactive (12/05 2037)  Baby Food Breast                                             GBS: (For PCN allergy, check sensitivities)  Contraception Unsure  Pap: Need PP  Circumcision Yes    Pediatrician List given CF:  Support Person Willie Scot(husband) SMA  Prenatal Classes Discussed Hgb electrophoresis:   Prenatal History/Complications:  Past Medical History: Past Medical History:  Diagnosis Date  . Medical history non-contributory     Past Surgical History: Past Surgical History:  Procedure Laterality Date  . WISDOM TOOTH EXTRACTION      Obstetrical History: OB History    Gravida Para Term Preterm AB Living   2 1 1     1    SAB TAB Ectopic Multiple Live Births           1      Social History: Social History   Socioeconomic History  . Marital status: Married    Spouse name: None  . Number of children: None  . Years of education: None  . Highest education level: None  Social Needs  . Financial resource strain: None  . Food insecurity - worry: None  . Food insecurity - inability: None  . Transportation needs - medical: None  .  Transportation needs - non-medical: None  Occupational History  . None  Tobacco Use  . Smoking status: Never Smoker  . Smokeless tobacco: Never Used  Substance and Sexual Activity  . Alcohol use: No    Frequency: Never  . Drug use: No  . Sexual activity: Yes  Other Topics Concern  . None  Social History Narrative  . None    Family History: Family History  Family history unknown: Yes    Allergies: No Known Allergies  Medications Prior to Admission  Medication Sig Dispense Refill Last Dose  . cyclobenzaprine (FLEXERIL) 10 MG tablet Take 1 tablet (10 mg total) by mouth 3 (three) times daily as needed for muscle spasms. 30 tablet 2 Taking  . ferrous sulfate (FERROUSUL) 325 (65 FE) MG tablet Take 1 tablet (325 mg total) by mouth 2 (two) times daily. 60 tablet 1 Taking  .  Prenatal Vit-Fe Fumarate-FA (PRENATAL VITAMIN PO) Take by mouth.   Taking  . Prenatal Vit-Fe Fumarate-FA (PREPLUS) 27-1 MG TABS Take 1 tablet by mouth daily. 30 tablet 3 Taking  . valACYclovir (VALTREX) 1000 MG tablet Take 1 tablet (1,000 mg total) by mouth daily. (Patient not taking: Reported on 01/15/2018) 5 tablet 0 Not Taking     Review of Systems   All systems reviewed and negative except as stated in HPI  Blood pressure 102/72, pulse (!) 110, temperature 98 F (36.7 C), temperature source Oral, resp. rate 16, height 5\' 4"  (1.626 m), weight 178 lb 9.6 oz (81 kg), last menstrual period 05/10/2017. General appearance: alert, cooperative and no distress Lungs: clear to auscultation bilaterally Heart: regular rate and rhythm Abdomen: soft, non-tender; bowel sounds normal Pelvic: n/a Extremities: Homans sign is negative, no sign of DVT DTR's +2 Presentation: cephalic Fetal monitoringBaseline: 140 bpm, Variability: Good {> 6 bpm), Accelerations: Reactive and Decelerations: Absent Uterine activityFrequency: Every 2-6 minutes Dilation: Fingertip Effacement (%): Thick Station: -2 Exam by:: Herma CarsonLindsay Lima,  rn   Prenatal labs: ABO, Rh: --/--/O POS (12/05 2037) Antibody: Negative (12/17 0850) Rubella: 1.62 (12/05 2037) RPR: Non Reactive (01/07 1031)  HBsAg: Negative (12/05 2037)  HIV: Non Reactive (01/07 1031)  GBS:   Negative  Prenatal Transfer Tool  Maternal Diabetes: No Genetic Screening: Normal Maternal Ultrasounds/Referrals: Normal Fetal Ultrasounds or other Referrals:  None Maternal Substance Abuse:  No Significant Maternal Medications:  None Significant Maternal Lab Results: Lab values include: Group B Strep negative  Results for orders placed or performed during the hospital encounter of 02/07/18 (from the past 24 hour(s))  CBC   Collection Time: 02/07/18  7:55 AM  Result Value Ref Range   WBC 8.5 4.0 - 10.5 K/uL   RBC 3.95 3.87 - 5.11 MIL/uL   Hemoglobin 9.5 (L) 12.0 - 15.0 g/dL   HCT 16.130.0 (L) 09.636.0 - 04.546.0 %   MCV 75.9 (L) 78.0 - 100.0 fL   MCH 24.1 (L) 26.0 - 34.0 pg   MCHC 31.7 30.0 - 36.0 g/dL   RDW 40.918.5 (H) 81.111.5 - 91.415.5 %   Platelets 290 150 - 400 K/uL    Patient Active Problem List   Diagnosis Date Noted  . Post-dates pregnancy 02/07/2018  . Anemia in pregnancy, third trimester 11/27/2017  . Supervision of other normal pregnancy, antepartum 11/06/2017    Assessment/Plan:  Nancy Hess is a 22 y.o. G2P1001 at 5257w0d here for IOL for postdates  #Labor: cytotec #Pain: Plans waterbirth #FWB: Cat 1 #ID:  GBS neg #MOF: Breast #MOC: none, abstinence  #Circ:  Yes, outpatient  Rolm BookbinderCaroline M Mujtaba Bollig, CNM  02/07/2018, 8:54 AM

## 2018-02-07 NOTE — Progress Notes (Signed)
Labor Progress Note Nancy Hess is a 22 y.o. G2P1001 at 3549w0d presented for IOL for postdates  S:  Patient comfortable, rating contractions a 4/10  O:  BP 123/70   Pulse (!) 110   Temp 99 F (37.2 C) (Oral)   Resp 16   Ht 5\' 4"  (1.626 m)   Wt 178 lb 9.6 oz (81 kg)   LMP 05/10/2017   BMI 30.66 kg/m    Fetal Tracing:  Baseline: 145 Variability: moderate Accels: 15x15 Decels: none  Toco: 1-5   CVE: Dilation: Fingertip Effacement (%): Thick Cervical Position: Posterior Station: -2 Presentation: Vertex Exam by:: Nancy Hess. Nancy Hess CNM   A&P: 22 y.o. G2P1001 6549w0d postdates IOL #Labor: Continue cytotec for ripening #Pain: plans waterbirth #FWB: Cat 1 #GBS negative  Nancy Hess, CNM 12:23 PM

## 2018-02-07 NOTE — Anesthesia Procedure Notes (Signed)
Epidural Patient location during procedure: OB Start time: 02/07/2018 3:00 PM End time: 02/07/2018 3:05 PM  Staffing Anesthesiologist: Bethena Midgetddono, Dontavian Marchi, MD  Preanesthetic Checklist Completed: patient identified, site marked, surgical consent, pre-op evaluation, timeout performed, IV checked, risks and benefits discussed and monitors and equipment checked  Epidural Patient position: sitting Prep: site prepped and draped and DuraPrep Patient monitoring: continuous pulse ox and blood pressure Approach: midline Location: L4-L5 Injection technique: LOR air  Needle:  Needle type: Tuohy  Needle gauge: 17 G Needle length: 9 cm and 9 Needle insertion depth: 5 cm cm Catheter type: closed end flexible Catheter size: 19 Gauge Catheter at skin depth: 11 cm Test dose: negative  Assessment Events: blood not aspirated, injection not painful, no injection resistance, negative IV test and no paresthesia

## 2018-02-07 NOTE — Anesthesia Pain Management Evaluation Note (Signed)
  CRNA Pain Management Visit Note  Patient: Nancy FosterBeverly Beagley, 22 y.o., female  "Hello [facility] am a [facility]. We have an anesthesia team available at all times to provide care throughout the hospital, including epidural management and anesthesia for C-section. I don't know your plan for the delivery whether it a natural birth, water birth, IV sedation, nitrous supplementation, doula or epidural, but we want to meet your pain goals."   1.Was your pain managed to your expectations on prior hospitalizations?   No   2.What is your expectation for pain management during this hospitalization?     Water tub  3.How can we help you reach that goal? Pt stated she didn't want education regarding pain option because of her water birth plan;told she could change her mind and to let us know if we could help  Record the patient's initial score and the patient's pain goal.   Pain: 3  Pain Goal: 10 [facility] wants you to be able to say your pain was always managed very well.  Edison PaceWILKERSON,Zenith Kercheval 02/07/2018

## 2018-02-07 NOTE — Anesthesia Preprocedure Evaluation (Signed)

## 2018-02-08 ENCOUNTER — Encounter (HOSPITAL_COMMUNITY): Payer: Self-pay

## 2018-02-08 LAB — CBC
HCT: 28 % — ABNORMAL LOW (ref 36.0–46.0)
Hemoglobin: 8.9 g/dL — ABNORMAL LOW (ref 12.0–15.0)
MCH: 23.9 pg — AB (ref 26.0–34.0)
MCHC: 31.8 g/dL (ref 30.0–36.0)
MCV: 75.3 fL — AB (ref 78.0–100.0)
Platelets: 261 10*3/uL (ref 150–400)
RBC: 3.72 MIL/uL — ABNORMAL LOW (ref 3.87–5.11)
RDW: 18.4 % — AB (ref 11.5–15.5)
WBC: 12.8 10*3/uL — ABNORMAL HIGH (ref 4.0–10.5)

## 2018-02-08 NOTE — Anesthesia Postprocedure Evaluation (Signed)
Anesthesia Post Note  Patient: Nancy FosterBeverly Garringer  Procedure(s) Performed: AN AD HOC LABOR EPIDURAL     Patient location during evaluation: Mother Baby Anesthesia Type: Epidural Level of consciousness: awake and alert Pain management: pain level controlled Vital Signs Assessment: post-procedure vital signs reviewed and stable Respiratory status: spontaneous breathing, nonlabored ventilation and respiratory function stable Cardiovascular status: stable Postop Assessment: no headache, no backache and epidural receding Anesthetic complications: no    Last Vitals:  Vitals:   02/08/18 0115 02/08/18 0515  BP: (!) 117/51 109/60  Pulse: 84 85  Resp: 18 17  Temp: 37.2 C 36.6 C  SpO2: 98% 98%    Last Pain:  Vitals:   02/08/18 0515  TempSrc: Oral  PainSc: 0-No pain   Pain Goal:                 Junious SilkGILBERT,Georgene Kopper

## 2018-02-08 NOTE — Progress Notes (Addendum)
POSTPARTUM PROGRESS NOTE  Post Partum Day 1 Subjective:  Nancy Hess is a 22 y.o. G2P1001 6385w1d s/p VAVD after IOL for postdates.  No acute events overnight.  Pt denies problems with ambulating, voiding or po intake.  She denies nausea or vomiting.  Pain is well controlled. Lochia Minimal.   Objective: Blood pressure 109/60, pulse 85, temperature 97.8 F (36.6 C), temperature source Oral, resp. rate 17, height 5\' 4"  (1.626 m), weight 81 kg (178 lb 9.6 oz), last menstrual period 05/10/2017, SpO2 98 %.  Physical Exam:  General: alert, cooperative and no distress Lochia:normal flow Chest: no respiratory distress Heart:regular rate, distal pulses intact Abdomen: soft, nontender,  Uterine Fundus: firm, appropriately tender DVT Evaluation: No calf swelling or tenderness Extremities: no edema  Recent Labs    02/07/18 0755 02/08/18 0645  HGB 9.5* 8.9*  HCT 30.0* 28.0*    Assessment/Plan:  ASSESSMENT: Nancy Hess is a 22 y.o. G2P1001 5885w1d s/p VAVD after IOL for postdates.   -Plan discharge home tomorrow -MOF: breast -MOC: abstinence    LOS: 1 day    S. Ryanne Corder Medical Student 02/08/18 07:24  Stephanie R CorderDO 02/08/2018, 7:22 AM   OB FELLOW MEDICAL STUDENT NOTE ATTESTATION  I confirm that I have verified the information documented in the medical student's note and that I have also personally performed the physical exam and all medical decision making activities.  Pt doing well. VS wnl. Fundus firm.   A/P: PPD#1: continue routine PP care Contraception: NFP; conseled Feeding: breast Dispo: Plan for discharge tomorrow.  Frederik PearJulie P Degele, MD OB Fellow 02/08/2018, 8:39 AM

## 2018-02-08 NOTE — Lactation Note (Signed)
This note was copied from a baby's chart. Lactation Consultation Note Baby 9 hrs old. Experienced BF mom stated BF going well, BF her now 89 yr old for 1 yr w/o difficulty. Baby is latching well, hearing swallows. Encouraged mom to call for assistance or questions. Mom stated she though she was good and didn't need anything from Lactation at this time. Lactation brochure left at bedside.  Patient Name: Nancy Hess ZOXWR'UToday's Date: 02/08/2018 Reason for consult: Initial assessment   Maternal Data Has patient been taught Hand Expression?: Yes Does the patient have breastfeeding experience prior to this delivery?: Yes  Feeding Feeding Type: Breast Milk Length of feed: 40 min  LATCH Score       Type of Nipple: Everted at rest and after stimulation  Comfort (Breast/Nipple): Soft / non-tender        Interventions Interventions: Breast feeding basics reviewed  Lactation Tools Discussed/Used WIC Program: No   Consult Status Consult Status: PRN Date: 02/09/18 Follow-up type: In-patient    Charyl DancerCARVER, Areyanna Figeroa G 02/08/2018, 3:40 AM

## 2018-02-09 NOTE — Discharge Instructions (Signed)
Postpartum Care After Vaginal Delivery °The period of time right after you deliver your newborn is called the postpartum period. °What kind of medical care will I receive? °· You may continue to receive fluids and medicines through an IV tube inserted into one of your veins. °· If an incision was made near your vagina (episiotomy) or if you had some vaginal tearing during delivery, cold compresses may be placed on your episiotomy or your tear. This helps to reduce pain and swelling. °· You may be given a squirt bottle to use when you go to the bathroom. You may use this until you are comfortable wiping as usual. To use the squirt bottle, follow these steps: °? Before you urinate, fill the squirt bottle with warm water. Do not use hot water. °? After you urinate, while you are sitting on the toilet, use the squirt bottle to rinse the area around your urethra and vaginal opening. This rinses away any urine and blood. °? You may do this instead of wiping. As you start healing, you may use the squirt bottle before wiping yourself. Make sure to wipe gently. °? Fill the squirt bottle with clean water every time you use the bathroom. °· You will be given sanitary pads to wear. °How can I expect to feel? °· You may not feel the need to urinate for several hours after delivery. °· You will have some soreness and pain in your abdomen and vagina. °· If you are breastfeeding, you may have uterine contractions every time you breastfeed for up to several weeks postpartum. Uterine contractions help your uterus return to its normal size. °· It is normal to have vaginal bleeding (lochia) after delivery. The amount and appearance of lochia is often similar to a menstrual period in the first week after delivery. It will gradually decrease over the next few weeks to a dry, yellow-brown discharge. For most women, lochia stops completely by 6-8 weeks after delivery. Vaginal bleeding can vary from woman to woman. °· Within the first few  days after delivery, you may have breast engorgement. This is when your breasts feel heavy, full, and uncomfortable. Your breasts may also throb and feel hard, tightly stretched, warm, and tender. After this occurs, you may have milk leaking from your breasts. Your health care provider can help you relieve discomfort due to breast engorgement. Breast engorgement should go away within a few days. °· You may feel more sad or worried than normal due to hormonal changes after delivery. These feelings should not last more than a few days. If these feelings do not go away after several days, speak with your health care provider. °How should I care for myself? °· Tell your health care provider if you have pain or discomfort. °· Drink enough water to keep your urine clear or pale yellow. °· Wash your hands thoroughly with soap and water for at least 20 seconds after changing your sanitary pads, after using the toilet, and before holding or feeding your baby. °· If you are not breastfeeding, avoid touching your breasts a lot. Doing this can make your breasts produce more milk. °· If you become weak or lightheaded, or you feel like you might faint, ask for help before: °? Getting out of bed. °? Showering. °· Change your sanitary pads frequently. Watch for any changes in your flow, such as a sudden increase in volume, a change in color, the passing of large blood clots. If you pass a blood clot from your vagina, save it   to show to your health care provider. Do not flush blood clots down the toilet without having your health care provider look at them. °· Make sure that all your vaccinations are up to date. This can help protect you and your baby from getting certain diseases. You may need to have immunizations done before you leave the hospital. °· If desired, talk with your health care provider about methods of family planning or birth control (contraception). °How can I start bonding with my baby? °Spending as much time as  possible with your baby is very important. During this time, you and your baby can get to know each other and develop a bond. Having your baby stay with you in your room (rooming in) can give you time to get to know your baby. Rooming in can also help you become comfortable caring for your baby. Breastfeeding can also help you bond with your baby. °How can I plan for returning home with my baby? °· Make sure that you have a car seat installed in your vehicle. °? Your car seat should be checked by a certified car seat installer to make sure that it is installed safely. °? Make sure that your baby fits into the car seat safely. °· Ask your health care provider any questions you have about caring for yourself or your baby. Make sure that you are able to contact your health care provider with any questions after leaving the hospital. °This information is not intended to replace advice given to you by your health care provider. Make sure you discuss any questions you have with your health care provider. °Document Released: 09/04/2007 Document Revised: 04/11/2016 Document Reviewed: 10/12/2015 °Elsevier Interactive Patient Education © 2018 [employer] ° °

## 2018-02-09 NOTE — Lactation Note (Signed)
This note was copied from a baby's chart. Lactation Consultation Note  Patient Name: Nancy AgeeBoyB Noma Howden EAVWU'JToday's Date: 02/09/2018 Reason for consult: Follow-up assessment  Infant is now 6838 hours old and ready for discharge.  Mother states breastfeeding is going well and she has no questions/concerns at this time.  Infant is sleeping in mother's arms and no feeding cues observed.  Adequate voids/stools.  Reviewed engorgement prevention and treatment.  Mom made aware of O/P services, breastfeeding support groups, community resources, and our phone # for post-discharge questions. Mom made aware of O/P services, breastfeeding support groups, community resources, and our phone # for post-discharge questions.  Mother already has DEBP at home and manual pump at bedside.  She will be returning to work and knows how to increase her milk supply in preparation for returning to work.    Maternal Data    Feeding Feeding Type: Breast Milk Length of feed: 90 min  LATCH Score Latch: Grasps breast easily, tongue down, lips flanged, rhythmical sucking.  Audible Swallowing: Spontaneous and intermittent  Type of Nipple: Everted at rest and after stimulation  Comfort (Breast/Nipple): Filling, red/small blisters or bruises, mild/mod discomfort  Hold (Positioning): No assistance needed to correctly position infant at breast.  LATCH Score: 9  Interventions    Lactation Tools Discussed/Used     Consult Status Consult Status: Complete Date: 02/09/18 Follow-up type: In-patient    [name] 02/09/2018, 8:48 AM

## 2018-02-09 NOTE — Discharge Summary (Signed)
OB Discharge Summary     Patient Name: Nancy Hess DOB: April 25, 1996 MRN: [id number]  Date of admission: 02/07/2018 Delivering MD: Tilda Burrow   Date of discharge: 02/09/2018  Admitting diagnosis: INDUCTION Intrauterine pregnancy: 102w0d     Secondary diagnosis:  Active Problems:   Post-dates pregnancy  Additional problems: none     Discharge diagnosis: Term Pregnancy Delivered                                                                                                Post partum procedures:none  Augmentation: Pitocin and Cytotec  Complications: None  Hospital course:  Induction of Labor With Vaginal Delivery   22 y.o. yo [id number] at [redacted]w[redacted]d was admitted to the hospital 02/07/2018 for induction of labor.  Indication for induction: Postdates.  Patient had an uncomplicated labor course as follows: Membrane Rupture Time/Date: 3:44 PM ,02/07/2018   Intrapartum Procedures: Episiotomy: None [1]                                         Lacerations:  1st degree [2]  Patient had delivery of a Viable infant.  Information for the patient's newborn:  Nancy, Hess [[id number]]  Delivery Method: Vag-Spont   02/07/2018  Details of delivery can be found in separate delivery note.  Patient had a routine postpartum course. Patient is discharged home 02/09/18.  Physical exam  Vitals:   02/08/18 0115 02/08/18 0515 02/08/18 1832 02/09/18 0603  BP: (!) 117/51 109/60 (!) 117/45 (!) 106/58  Pulse: 84 85 82 75  Resp: 18 17 18 18   Temp: 99 F (37.2 C) 97.8 F (36.6 C) 98.2 F (36.8 C) 98.4 F (36.9 C)  TempSrc: Oral Oral Oral Oral  SpO2: 98% 98%    Weight:      Height:       General: alert, cooperative and no distress Lochia: appropriate Uterine Fundus: firm Incision: N/A DVT Evaluation: No evidence of DVT seen on physical exam. Negative Homan's sign. No cords or calf tenderness. Labs: Lab Results  Component Value Date   WBC 12.8 (H) 02/08/2018   HGB 8.9 (L) 02/08/2018    HCT 28.0 (L) 02/08/2018   MCV 75.3 (L) 02/08/2018   PLT 261 02/08/2018   No flowsheet data found.  Discharge instruction: per After Visit Summary and "Baby and Me Booklet".  After visit meds:  Allergies as of 02/09/2018   No Known Allergies     Medication List    STOP taking these medications   cyclobenzaprine 10 MG tablet Commonly known as:  FLEXERIL   ferrous sulfate 325 (65 FE) MG tablet Commonly known as:  FERROUSUL   PREPLUS 27-1 MG Tabs   valACYclovir 1000 MG tablet Commonly known as:  VALTREX       Diet: routine diet  Activity: Advance as tolerated. Pelvic rest for 6 weeks.   Outpatient follow up: Follow up Appt: Future Appointments  Date Time Provider Department Center  03/16/2018  2:15 PM Armando Reichert, CNM  [facility]   Follow up Visit:  Postpartum contraception: None  Newborn Data: Live born female  Birth Weight: 8 lb 12.6 oz (3985 g) APGAR: 5, 8  Newborn Delivery   Birth date/time:  02/07/2018 18:29:00 Delivery type:  Vaginal, Vacuum (Extractor)     Baby Feeding: Breast Disposition:home with mother   02/09/2018 Nancy Hess, CNM

## 2018-03-16 ENCOUNTER — Ambulatory Visit: Payer: Medicaid Other | Admitting: Advanced Practice Midwife

## 2018-04-02 ENCOUNTER — Encounter: Payer: Self-pay | Admitting: Advanced Practice Midwife

## 2018-04-12 ENCOUNTER — Encounter: Payer: Self-pay | Admitting: Family Medicine

## 2018-05-20 ENCOUNTER — Encounter (HOSPITAL_COMMUNITY): Payer: Self-pay | Admitting: *Deleted

## 2018-05-20 ENCOUNTER — Emergency Department (HOSPITAL_COMMUNITY)
Admission: EM | Admit: 2018-05-20 | Discharge: 2018-05-20 | Disposition: A | Discharge status: Home / Self Care | Payer: Medicaid Other | Attending: Emergency Medicine | Admitting: Emergency Medicine

## 2018-05-20 ENCOUNTER — Other Ambulatory Visit: Payer: Self-pay

## 2018-05-20 DIAGNOSIS — W230XXA Caught, crushed, jammed, or pinched between moving objects, initial encounter: Secondary | ICD-10-CM | POA: Diagnosis not present

## 2018-05-20 DIAGNOSIS — S60131A Contusion of right middle finger with damage to nail, initial encounter: Secondary | ICD-10-CM | POA: Insufficient documentation

## 2018-05-20 DIAGNOSIS — Y999 Unspecified external cause status: Secondary | ICD-10-CM | POA: Diagnosis not present

## 2018-05-20 DIAGNOSIS — Y939 Activity, unspecified: Secondary | ICD-10-CM | POA: Insufficient documentation

## 2018-05-20 DIAGNOSIS — Y929 Unspecified place or not applicable: Secondary | ICD-10-CM | POA: Insufficient documentation

## 2018-05-20 DIAGNOSIS — S6010XA Contusion of unspecified finger with damage to nail, initial encounter: Secondary | ICD-10-CM

## 2018-05-20 DIAGNOSIS — S6991XA Unspecified injury of right wrist, hand and finger(s), initial encounter: Secondary | ICD-10-CM

## 2018-05-20 DIAGNOSIS — S6981XA Other specified injuries of right wrist, hand and finger(s), initial encounter: Secondary | ICD-10-CM | POA: Diagnosis present

## 2018-05-20 NOTE — ED Provider Notes (Signed)
[facility] EMERGENCY DEPARTMENT Provider Note   CSN: [id number] Arrival date & time: 05/20/18  1543     History   Chief Complaint Chief Complaint  Patient presents with  . Finger Injury    HPI Nancy Hess is a 22 y.o. female who presents to the ED for concern for loose R 3rd digit nail since yesterday. Patient states that she caught her finger in a car door 3 weeks ago. She states she immediately had bruising just below the nail. This area was sore for a few days after the incident, but has overall been okay. Yesterday she noticed that the nail felt a bit loose. She trimmed the nail back a bit. She states it is only painful if she is moving the nail, otherwise no specific alleviating/aggravating factors, not painful when not touching it. Denies fever, chills, numbness, or weakness. Patient is R hand dominant.   HPI  Past Medical History:  Diagnosis Date  . Medical history non-contributory     Patient Active Problem List   Diagnosis Date Noted  . Post-dates pregnancy 02/07/2018  . Anemia in pregnancy, third trimester 11/27/2017  . Supervision of other normal pregnancy, antepartum 11/06/2017    Past Surgical History:  Procedure Laterality Date  . WISDOM TOOTH EXTRACTION       OB History    Gravida  2   Para  2   Term  2   Preterm      AB      Living  2     SAB      TAB      Ectopic      Multiple  0   Live Births  2            Home Medications    Prior to Admission medications   Not on File    Family History Family History  Family history unknown: Yes    Social History Social History   Tobacco Use  . Smoking status: Never Smoker  . Smokeless tobacco: Never Used  Substance Use Topics  . Alcohol use: No    Frequency: Never  . Drug use: No     Allergies   Patient has no known allergies.   Review of Systems Review of Systems  Constitutional: Negative for chills and fever.  Skin:       Positive for loose  nail to R 3rd digit.   Neurological: Negative for weakness and numbness.   Physical Exam Updated Vital Signs BP 103/64 (BP Location: Right Arm)   Pulse 71   Temp 98.5 F (36.9 C) (Oral)   Resp 16   Ht 5\' 3"  (1.6 m)   Wt 63.5 kg (140 lb)   LMP 05/18/2018   SpO2 98%   BMI 24.80 kg/m   Physical Exam  Constitutional: She appears well-developed and well-nourished. No distress.  HENT:  Head: Normocephalic and atraumatic.  Eyes: Conjunctivae are normal. Right eye exhibits no discharge. Left eye exhibits no discharge.  Cardiovascular:  Pulses:      Radial pulses are 2+ on the right side, and 2+ on the left side.  Musculoskeletal:  Upper extremities: Normal ROM to bilateral wrists and all digits (MCPs/PIPs/DIPs). No bony tenderness.  Neurological: She is alert.  Clear speech. Able to perform OK sign, thumbs up, and cross 2nd/3rd digits. 5/5 symmetric grip strength. sensation grossly intact to bilateral upper extremities.   Skin: Capillary refill takes less than 2 seconds.  R 3rd finger: subungual hematoma  present proximally, nail is minimally mobile, however appears to be still fairly firmly attached. Nontender to palpation. NVI distally. No appreciable lacerations. No overlying erythema or warmth.   Psychiatric: She has a normal mood and affect. Her behavior is normal. Thought content normal.  Nursing note and vitals reviewed.    ED Treatments / Results  Labs (all labs ordered are listed, but only abnormal results are displayed) Labs Reviewed - No data to display  EKG None  Radiology No results found.  Procedures Procedures (including critical care time)  Medications Ordered in ED Medications - No data to display   Initial Impression / Assessment and Plan / ED Course  I have reviewed the triage vital signs and the nursing notes.  Pertinent labs & imaging results that were available during my care of the patient were reviewed by me and considered in my medical decision  making (see chart for details).   Patient presents with concern for loose nail s/p injury 3 weeks ago. Patient with subungual hematoma, nail still fairly firmly attached, minimal mobility. No tenderness to raise concern for fracture/dislocation. No erythema/warmth/fevers to raise concern for infection. Offered nail trephination, patient declined. Recommended supportive bandage, nail will likely fall of on own, given remains fairly firmly attached do not feel that removal in the ER is necessary discussed with patient and she is in agreement and does not wish to have this procedure done. I discussed  treatment plan, need for PCP follow-up, and return precautions with the patient. Provided opportunity for questions, patient confirmed understanding and is in agreement with plan.     Final Clinical Impressions(s) / ED Diagnoses   Final diagnoses:  Injury of finger of right hand, initial encounter  Subungual hematoma of digit of hand, initial encounter    ED Discharge Orders    None       Desmond Lope 05/20/18 Juleen China, MD 05/20/18 2350

## 2018-05-20 NOTE — ED Triage Notes (Signed)
Rt middle finger caught in a car door 3 weeks ago now the fingernail is loose and threatening to come off.  2 days ago

## 2018-05-20 NOTE — Discharge Instructions (Addendum)
You were seen in the emergency department for a loose nail. There is some blood underneath of your nail from the injury. The nail does feel somewhat loose and will eventually likely fall off on its own. You may wear a bandage to avoid getting the nail caught on things. Follow up with PCP in 1 week for re-evaluation. Return to the ER for new or worsening symptoms or any other concerns.

## 2018-08-13 IMAGING — US US MFM OB DETAIL+14 WK
1 series · 14 of 28 positions shown · non-contrast
Comparison: none

[Series 1: us mfm ob detail+14 wk · 69 acquisitions, 14 frames shown]
[im 3/69]
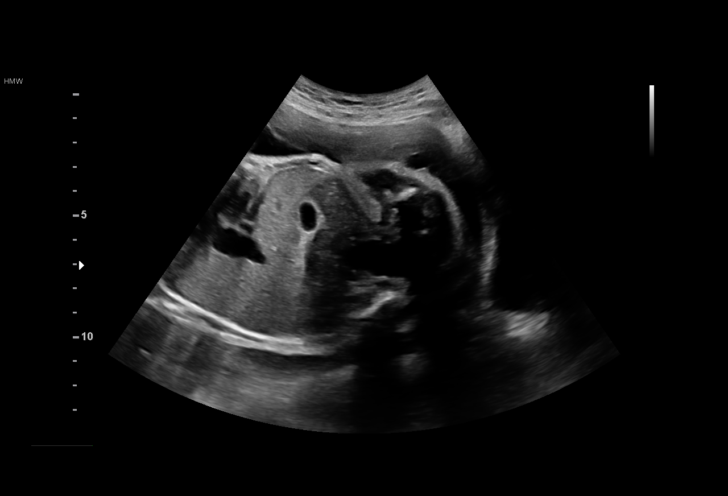
[im 8/69]
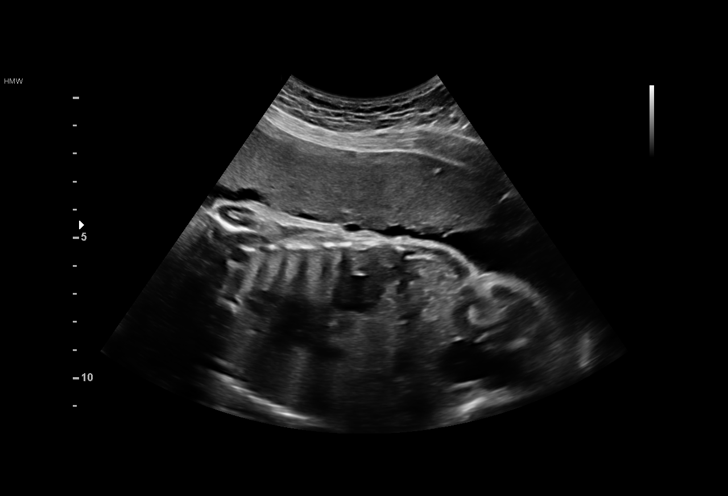
[im 13/69]
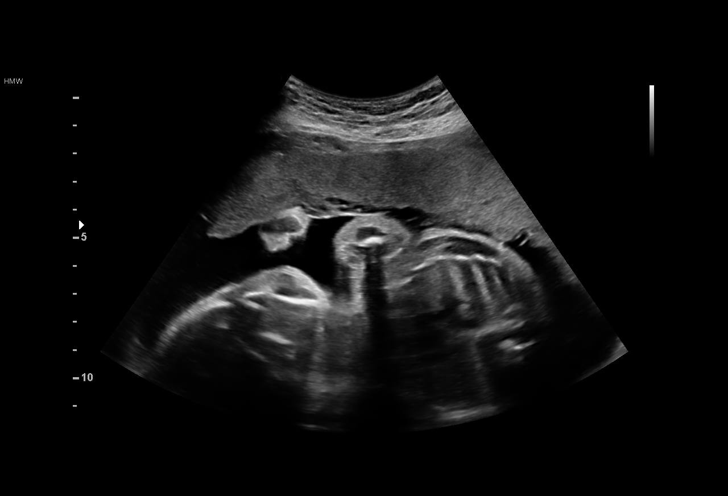
[im 18/69]
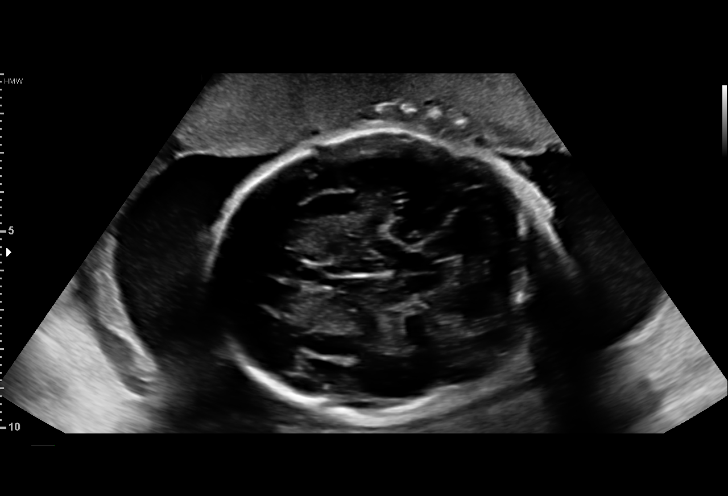
[im 23/69]
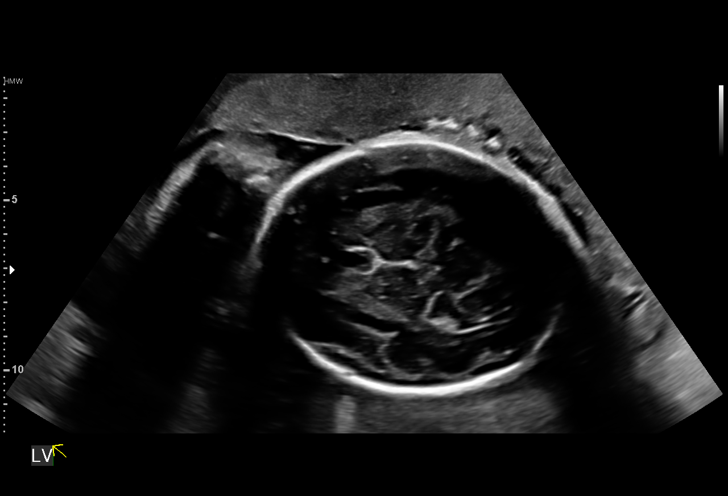
[im 28/69]
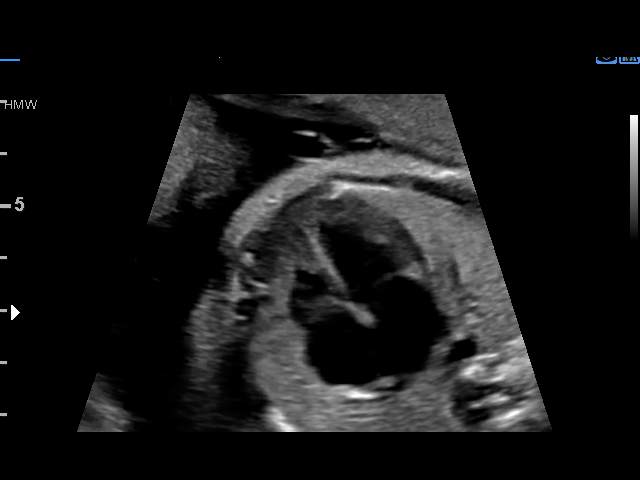
[im 33/69]
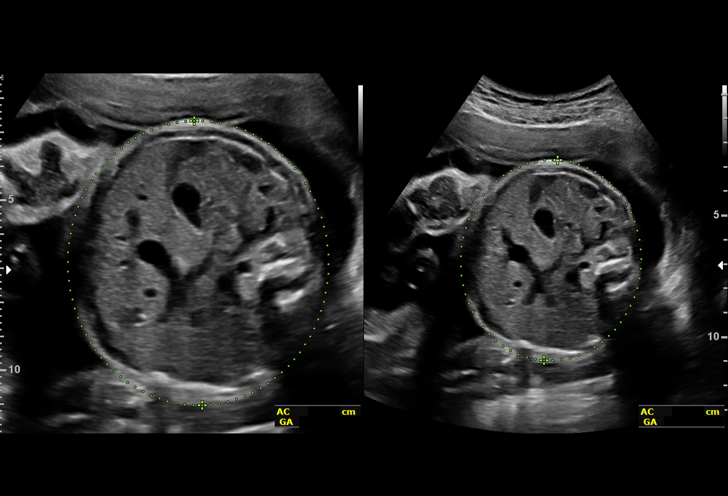
[im 38/69]
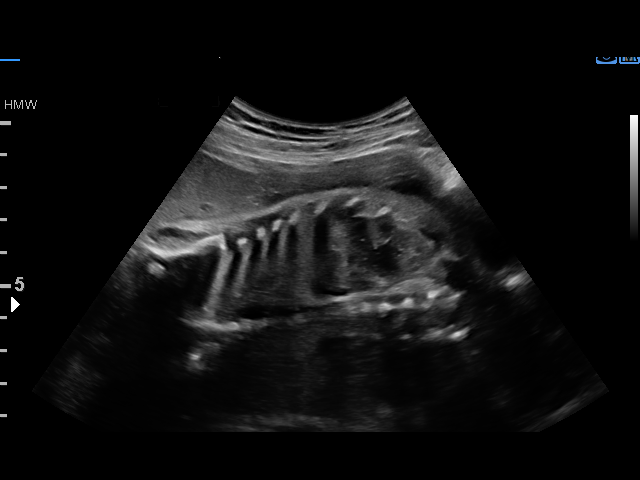
[im 43/69]
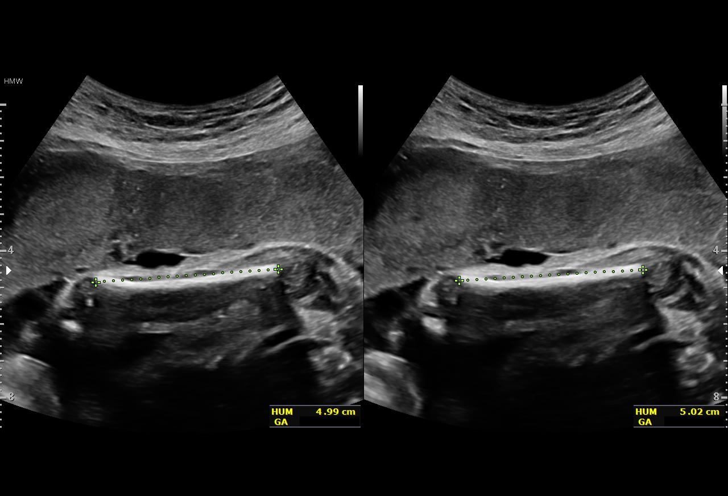
[im 48/69]
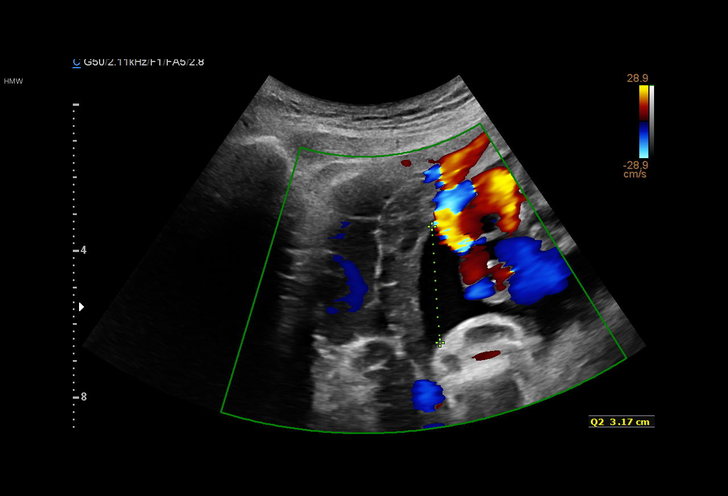
[im 53/69]
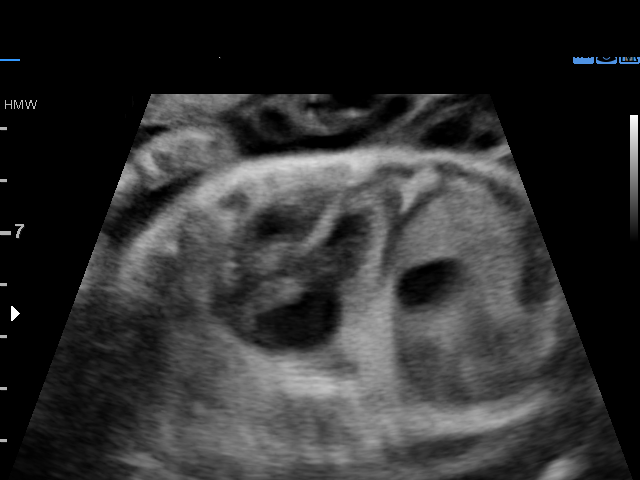
[im 58/69]
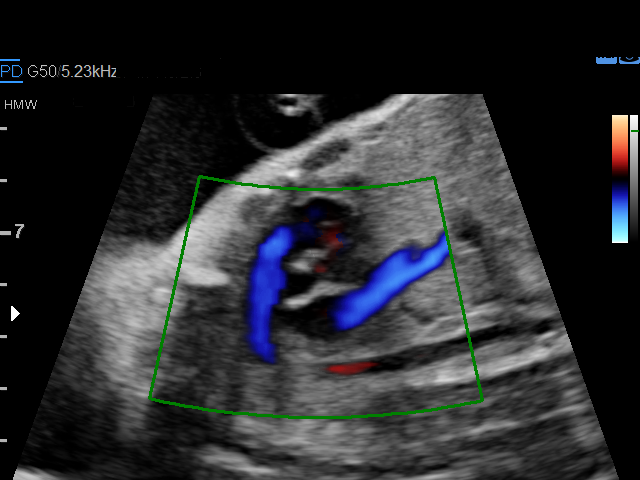
[im 63/69]
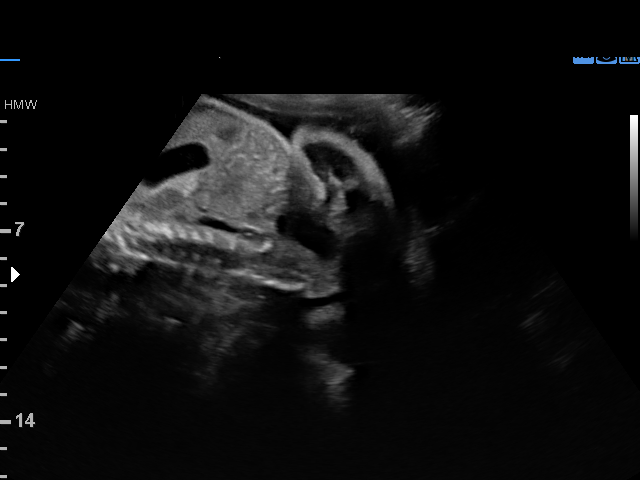
[im 69/69]
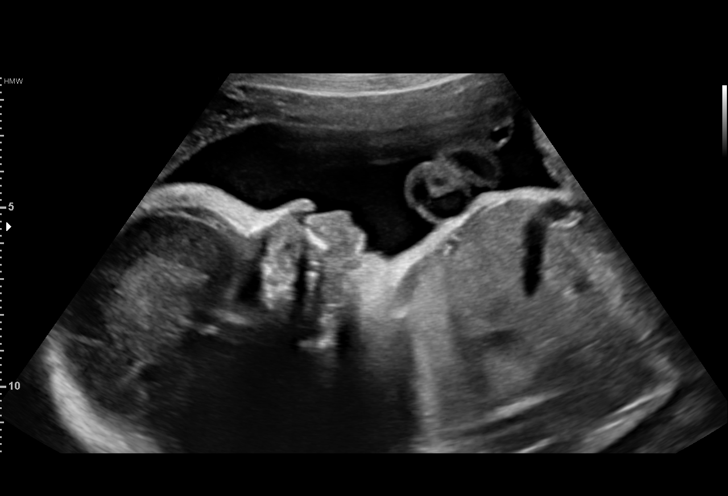

[14 of 28 positions shown; findings below may reference images not displayed]

OB/Gyn Clinic

1  NAZARETH JUMPER           [id number]      [id number]     [id number]
Indications

28 weeks gestation of pregnancy
Insufficient Prenatal Care
Encounter for antenatal screening for
malformations
OB History

Gravidity:    2         Term:   1        Prem:   0        SAB:   0
TOP:          0       Ectopic:  0        Living: 1
Fetal Evaluation

Num Of Fetuses:     1
Fetal Heart         143
Rate(bpm):
Cardiac Activity:   Observed
Presentation:       Breech
Placenta:           Anterior, above cervical os
P. Cord Insertion:  Visualized, central

Amniotic Fluid
AFI FV:      Subjectively within normal limits

AFI Sum(cm)     %Tile       Largest Pocket(cm)
15.84           57

RUQ(cm)       RLQ(cm)       LUQ(cm)        LLQ(cm)
4.52
Biometry

BPD:      72.5  mm     G. Age:  29w 1d         50  %    CI:        73.92   %    70 - 86
FL/HC:      19.1   %    19.6 -
HC:      267.8  mm     G. Age:  29w 1d         32  %    HC/AC:      1.08        0.99 -
AC:      249.1  mm     G. Age:  29w 1d         56  %    FL/BPD:     70.5   %    71 - 87
FL:       51.1  mm     G. Age:  27w 2d          8  %    FL/AC:      20.5   %    20 - 24
HUM:        50  mm     G. Age:  29w 2d         56  %

Est. FW:    0559  gm    2 lb 12 oz      48  %
Gestational Age

Clinical EDD:  28w 5d                                        EDD:   01/31/18
U/S Today:     28w 5d                                        EDD:   01/31/18
Best:          28w 5d     Det. By:  Clinical EDD             EDD:   01/31/18
Anatomy

Cranium:               Appears normal         Aortic Arch:            Appears normal
Cavum:                 Appears normal         Ductal Arch:            Appears normal
Ventricles:            Appears normal         Diaphragm:              Appears normal
Choroid Plexus:        Appears normal         Stomach:                Appears normal, left
sided
Cerebellum:            Appears normal         Abdomen:                Appears normal
Posterior Fossa:       Appears normal         Abdominal Wall:         Appears nml (cord
insert, abd wall)
Nuchal Fold:           Not applicable (>20    Cord Vessels:           Appears normal (3
wks GA)                                        vessel cord)
Face:                  Profile nl; orbits not Kidneys:                Appear normal
well visualized
Lips:                  Appears normal         Bladder:                Appears normal
Thoracic:              Appears normal         Spine:                  Not well visualized
Heart:                 Appears normal         Upper Extremities:      Appears normal
(4CH, axis, and situs
RVOT:                  Appears normal         Lower Extremities:      Appears normal
LVOT:                  Appears normal

Other:  Nasal bone visualized. Technically difficult due to advanced GA and
fetal position.
Cervix Uterus Adnexa

Cervix
Length:              3  cm.
Normal appearance by transabdominal scan.

Uterus
No abnormality visualized.

Left Ovary
No adnexal mass visualized.

Right Ovary
No adnexal mass visualized.

Cul De Sac:   No free fluid seen.

Adnexa:       No abnormality visualized.
Impression

SIUP at 28+5 weeks
Normal detailed fetal anatomy; limited views of spine (no
gross abnormalities noted)
Normal amniotic fluid volume
Measurements consistent with stated EDC (from 
12 wks
US); EFW at the 48th %tile
Recommendations

Follow-up ultrasounds as clinically indicated.

## 2020-05-08 ENCOUNTER — Emergency Department: Admit: 2020-05-08 | Discharge: 2020-05-08 | Discharge status: Against Medical Advice | Payer: MEDICAID

## 2020-05-08 ENCOUNTER — Encounter: Admit: 2020-05-08 | Discharge: 2020-05-08

## 2020-05-08 DIAGNOSIS — Z5321 Procedure and treatment not carried out due to patient leaving prior to being seen by health care provider: Secondary | ICD-10-CM

## 2020-05-08 DIAGNOSIS — B839 Helminthiasis, unspecified: Secondary | ICD-10-CM

## 2020-05-08 MED ORDER — ALBENDAZOLE 200 MG PO TAB
400 mg | ORAL_TABLET | Freq: Every day | ORAL | 0 refills | Status: AC
Start: 2020-05-08 — End: ?
  Filled 2020-05-09: qty 8, 4d supply, fill #1

## 2020-05-08 NOTE — ED Notes
Observed pt returning to triage waiting area.

## 2020-05-08 NOTE — ED Notes
Observed pt and family member ambulating out of triage area with steady gait A&OX4.
